# Patient Record
Sex: Female | Born: 1985 | Race: Black or African American | Hispanic: No | Marital: Single | Smoking: Never smoker
Health system: Southern US, Community
[De-identification: ages and names within clinical notes are randomized; demographics above are authoritative.]

## PROBLEM LIST (undated history)

## (undated) DIAGNOSIS — R87619 Unspecified abnormal cytological findings in specimens from cervix uteri: Secondary | ICD-10-CM

## (undated) HISTORY — PX: CERVICAL CERCLAGE: SHX1329

## (undated) HISTORY — PX: COLPOSCOPY: SHX161

---
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
## 2009-10-24 DIAGNOSIS — O3432 Maternal care for cervical incompetence, second trimester: Secondary | ICD-10-CM

## 2013-01-18 ENCOUNTER — Emergency Department (HOSPITAL_COMMUNITY): Payer: BC Managed Care – PPO

## 2013-01-18 ENCOUNTER — Emergency Department (HOSPITAL_COMMUNITY)
Admission: EM | Admit: 2013-01-18 | Discharge: 2013-01-18 | Disposition: A | Discharge status: Home / Self Care | Payer: BC Managed Care – PPO | Attending: Emergency Medicine | Admitting: Emergency Medicine

## 2013-01-18 ENCOUNTER — Encounter (HOSPITAL_COMMUNITY): Payer: Self-pay | Admitting: Emergency Medicine

## 2013-01-18 DIAGNOSIS — S0990XA Unspecified injury of head, initial encounter: Secondary | ICD-10-CM | POA: Insufficient documentation

## 2013-01-18 DIAGNOSIS — Y9389 Activity, other specified: Secondary | ICD-10-CM | POA: Insufficient documentation

## 2013-01-18 DIAGNOSIS — Y92410 Unspecified street and highway as the place of occurrence of the external cause: Secondary | ICD-10-CM | POA: Insufficient documentation

## 2013-01-18 DIAGNOSIS — S139XXA Sprain of joints and ligaments of unspecified parts of neck, initial encounter: Secondary | ICD-10-CM

## 2013-01-18 MED ORDER — HYDROCODONE-ACETAMINOPHEN 5-325 MG PO TABS
1.0000 | ORAL_TABLET | Freq: Once | ORAL | Status: AC
  Administered 2013-01-18: 1 via ORAL
  Filled 2013-01-18: qty 1

## 2013-01-18 MED ORDER — HYDROCODONE-ACETAMINOPHEN 5-325 MG PO TABS: 2.0000 | ORAL_TABLET | ORAL | Status: DC | PRN

## 2013-01-18 NOTE — ED Notes (Signed)
Pt back to room.
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
## 2013-01-18 NOTE — ED Notes (Signed)
Updated pt on poc and delay 

## 2013-01-18 NOTE — ED Notes (Signed)
Pt to CT scan via stretcher with transport 
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
## 2013-09-30 ENCOUNTER — Emergency Department (HOSPITAL_COMMUNITY)
Admission: EM | Admit: 2013-09-30 | Discharge: 2013-09-30 | Disposition: A | Discharge status: Home / Self Care | Payer: BC Managed Care – PPO | Attending: Emergency Medicine | Admitting: Emergency Medicine

## 2013-09-30 ENCOUNTER — Encounter (HOSPITAL_COMMUNITY): Payer: Self-pay | Admitting: Emergency Medicine

## 2013-09-30 DIAGNOSIS — M25562 Pain in left knee: Secondary | ICD-10-CM

## 2013-09-30 DIAGNOSIS — M25569 Pain in unspecified knee: Secondary | ICD-10-CM | POA: Insufficient documentation

## 2013-09-30 DIAGNOSIS — R209 Unspecified disturbances of skin sensation: Secondary | ICD-10-CM | POA: Insufficient documentation

## 2013-09-30 MED ORDER — IBUPROFEN 800 MG PO TABS: 800.0000 mg | ORAL_TABLET | Freq: Three times a day (TID) | ORAL | Status: DC
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
## 2013-09-30 NOTE — ED Notes (Signed)
Painful lt knee for 2 weeks no known injury.  She works on a [employer] doctor. No swelling

## 2013-09-30 NOTE — ED Notes (Signed)
PA Katie at bedside. 

## 2013-09-30 NOTE — ED Provider Notes (Signed)
Medical screening examination/treatment/procedure(s) were performed by non-physician practitioner and as supervising physician I was immediately available for consultation/collaboration.  EKG Interpretation   None         Hanley Seamen, MD 09/30/13 [phone]

## 2014-03-24 ENCOUNTER — Encounter (HOSPITAL_COMMUNITY): Payer: Self-pay | Admitting: Emergency Medicine

## 2014-03-24 ENCOUNTER — Emergency Department (INDEPENDENT_AMBULATORY_CARE_PROVIDER_SITE_OTHER)
Admission: EM | Admit: 2014-03-24 | Discharge: 2014-03-24 | Disposition: A | Discharge status: Home / Self Care | Payer: Self-pay | Source: Home / Self Care | Attending: Family Medicine | Admitting: Family Medicine

## 2014-03-24 DIAGNOSIS — B9562 Methicillin resistant Staphylococcus aureus infection as the cause of diseases classified elsewhere: Secondary | ICD-10-CM

## 2014-03-24 DIAGNOSIS — L0390 Cellulitis, unspecified: Principal | ICD-10-CM

## 2014-03-24 DIAGNOSIS — A4902 Methicillin resistant Staphylococcus aureus infection, unspecified site: Secondary | ICD-10-CM

## 2014-03-24 DIAGNOSIS — L0291 Cutaneous abscess, unspecified: Secondary | ICD-10-CM

## 2014-03-24 MED ORDER — MUPIROCIN CALCIUM 2 % EX CREA: 1.0000 "application " | TOPICAL_CREAM | Freq: Two times a day (BID) | CUTANEOUS | Status: DC

## 2014-03-24 MED ORDER — MINOCYCLINE HCL 100 MG PO CAPS: 100.0000 mg | ORAL_CAPSULE | Freq: Two times a day (BID) | ORAL | Status: DC

## 2014-03-24 NOTE — ED Notes (Signed)
C/o another boil on her leg

## 2014-03-24 NOTE — ED Provider Notes (Signed)
CSN: [id number]     Arrival date & time 03/24/14  1736 History   First MD Initiated Contact with Patient 03/24/14 1819     Chief Complaint  Patient presents with  . Skin Problem   (Consider location/radiation/quality/duration/timing/severity/associated sxs/prior Treatment) Patient is a 28 y.o. female presenting with abscess.  Abscess Location:  Leg Leg abscess location:  R lower leg Abscess quality: painful and redness   Abscess quality: not draining and no fluctuance   Red streaking: no   Duration:  3 days Progression:  Worsening Pain details:    Severity:  Mild Chronicity:  Recurrent Associated symptoms: no fever   Risk factors: family hx of MRSA and prior abscess     History reviewed. No pertinent past medical history. History reviewed. No pertinent past surgical history. History reviewed. No pertinent family history. History  Substance Use Topics  . Smoking status: Never Smoker   . Smokeless tobacco: Not on file  . Alcohol Use: No   OB History   Grav Para Term Preterm Abortions TAB SAB Ect Mult Living                 Review of Systems  Constitutional: Negative.  Negative for fever.  Skin: Positive for rash.    Allergies  Review of patient's allergies indicates no known allergies.  Home Medications   Prior to Admission medications   Medication Sig Start Date End Date Taking? Authorizing Provider  HYDROcodone-acetaminophen (NORCO/VICODIN) 5-325 MG per tablet Take 2 tablets by mouth every 4 (four) hours as needed for pain. 01/18/13   Chionesu Lytle Michaels, MD  ibuprofen (ADVIL,MOTRIN) 800 MG tablet Take 1 tablet (800 mg total) by mouth 3 (three) times daily. 09/30/13   Arie Sabina Schinlever, PA-C  minocycline (MINOCIN,DYNACIN) 100 MG capsule Take 1 capsule (100 mg total) by mouth 2 (two) times daily. 03/24/14   Linna Hoff, MD  mupirocin cream (BACTROBAN) 2 % Apply 1 application topically 2 (two) times daily. 03/24/14   Linna Hoff, MD   BP 116/78  Pulse 87   Temp(Src) 97.9 F (36.6 C) (Oral)  Resp 16  SpO2 100% Physical Exam  Nursing note and vitals reviewed. Constitutional: She is oriented to person, place, and time. She appears well-developed and well-nourished.  Musculoskeletal: She exhibits tenderness.  Neurological: She is alert and oriented to person, place, and time.  Skin: Skin is warm and dry. Rash noted.  Tender indurated erythematous papule to right lat calf , no fluctuance or drainage expressed.    ED Course  Procedures (including critical care time) Labs Review Labs Reviewed - No data to display  Imaging Review No results found.   MDM   1. MRSA cellulitis        Linna Hoff, MD 03/24/14 [phone]

## 2014-03-24 NOTE — Discharge Instructions (Signed)
Warm compress twice a day when you take the antibiotic, take all of medicine, return as needed. °

## 2014-03-28 ENCOUNTER — Encounter (HOSPITAL_COMMUNITY): Payer: Self-pay | Admitting: Emergency Medicine

## 2014-03-28 DIAGNOSIS — L03119 Cellulitis of unspecified part of limb: Principal | ICD-10-CM

## 2014-03-28 DIAGNOSIS — Z792 Long term (current) use of antibiotics: Secondary | ICD-10-CM | POA: Insufficient documentation

## 2014-03-28 DIAGNOSIS — Z79899 Other long term (current) drug therapy: Secondary | ICD-10-CM | POA: Insufficient documentation

## 2014-03-28 DIAGNOSIS — R51 Headache: Secondary | ICD-10-CM | POA: Insufficient documentation

## 2014-03-28 DIAGNOSIS — L02419 Cutaneous abscess of limb, unspecified: Secondary | ICD-10-CM | POA: Insufficient documentation

## 2014-03-28 DIAGNOSIS — Z8614 Personal history of Methicillin resistant Staphylococcus aureus infection: Secondary | ICD-10-CM | POA: Insufficient documentation

## 2014-03-28 NOTE — ED Notes (Signed)
Patient with boil on outer right calf.  She was seen on Monday, did not start the antibiotics until yesterday.  Patient states that she is also with a headache for last two days.

## 2014-03-29 ENCOUNTER — Emergency Department (HOSPITAL_COMMUNITY)
Admission: EM | Admit: 2014-03-29 | Discharge: 2014-03-29 | Disposition: A | Discharge status: Home / Self Care | Payer: Self-pay | Attending: Emergency Medicine | Admitting: Emergency Medicine

## 2014-03-29 DIAGNOSIS — L02415 Cutaneous abscess of right lower limb: Secondary | ICD-10-CM

## 2014-03-29 MED ORDER — IBUPROFEN 400 MG PO TABS
800.0000 mg | ORAL_TABLET | Freq: Once | ORAL | Status: AC
  Administered 2014-03-29: 800 mg via ORAL
  Filled 2014-03-29: qty 2

## 2014-03-29 NOTE — ED Provider Notes (Signed)
Medical screening examination/treatment/procedure(s) were performed by non-physician practitioner and as supervising physician I was immediately available for consultation/collaboration.   EKG Interpretation None        Ariyanah Aguado, MD 03/29/14 2302 

## 2014-03-29 NOTE — Discharge Instructions (Signed)
Abscess °An abscess is an infected area that contains a collection of pus and debris. It can occur in almost any part of the body. An abscess is also known as a furuncle or boil. °CAUSES  °An abscess occurs when tissue gets infected. This can occur from blockage of oil or sweat glands, infection of hair follicles, or a minor injury to the skin. As the body tries to fight the infection, pus collects in the area and creates pressure under the skin. This pressure causes pain. People with weakened immune systems have difficulty fighting infections and get certain abscesses more often.  °SYMPTOMS °Usually an abscess develops on the skin and becomes a painful mass that is red, warm, and tender. If the abscess forms under the skin, you may feel a moveable soft area under the skin. Some abscesses break open (rupture) on their own, but most will continue to get worse without care. The infection can spread deeper into the body and eventually into the bloodstream, causing you to feel ill.  °DIAGNOSIS  °Your caregiver will take your medical history and perform a physical exam. A sample of fluid may also be taken from the abscess to determine what is causing your infection. °TREATMENT  °Your caregiver may prescribe antibiotic medicines to fight the infection. However, taking antibiotics alone usually does not cure an abscess. Your caregiver may need to make a small cut (incision) in the abscess to drain the pus. In some cases, gauze is packed into the abscess to reduce pain and to continue draining the area. °HOME CARE INSTRUCTIONS  °· Only take over-the-counter or prescription medicines for pain, discomfort, or fever as directed by your caregiver. °· If you were prescribed antibiotics, take them as directed. Finish them even if you start to feel better. °· If gauze is used, follow your caregiver's directions for changing the gauze. °· To avoid spreading the infection: °· Keep your draining abscess covered with a  bandage. °· Wash your hands well. °· Do not share personal care items, towels, or whirlpools with others. °· Avoid skin contact with others. °· Keep your skin and clothes clean around the abscess. °· Keep all follow-up appointments as directed by your caregiver. °SEEK MEDICAL CARE IF:  °· You have increased pain, swelling, redness, fluid drainage, or bleeding. °· You have muscle aches, chills, or a general ill feeling. °· You have a fever. °MAKE SURE YOU:  °· Understand these instructions. °· Will watch your condition. °· Will get help right away if you are not doing well or get worse. °Document Released: 07/20/2005 Document Revised: 04/10/2012 Document Reviewed: 12/23/2011 °ExitCare® Patient Information ©2014 [employer]. ° °Abscess °Care After °An abscess (also called a boil or furuncle) is an infected area that contains a collection of pus. Signs and symptoms of an abscess include pain, tenderness, redness, or hardness, or you may feel a moveable soft area under your skin. An abscess can occur anywhere in the body. The infection may spread to surrounding tissues causing cellulitis. A cut (incision) by the surgeon was made over your abscess and the pus was drained out. Gauze may have been packed into the space to provide a drain that will allow the cavity to heal from the inside outwards. The boil may be painful for 5 to 7 days. Most people with a boil do not have high fevers. Your abscess, if seen early, may not have localized, and may not have been lanced. If not, another appointment may be required for this if it does   not get better on its own or with medications. °HOME CARE INSTRUCTIONS  °· Only take over-the-counter or prescription medicines for pain, discomfort, or fever as directed by your caregiver. °· When you bathe, soak and then remove gauze or iodoform packs at least daily or as directed by your caregiver. You may then wash the wound gently with mild soapy water. Repack with gauze or do as your  caregiver directs. °SEEK IMMEDIATE MEDICAL CARE IF:  °· You develop increased pain, swelling, redness, drainage, or bleeding in the wound site. °· You develop signs of generalized infection including muscle aches, chills, fever, or a general ill feeling. °· An oral temperature above 102° F (38.9° C) develops, not controlled by medication. °See your caregiver for a recheck if you develop any of the symptoms described above. If medications (antibiotics) were prescribed, take them as directed. °Document Released: 04/28/2005 Document Revised: 01/02/2012 Document Reviewed: 12/24/2007 °ExitCare® Patient Information ©2014 [employer]. ° °

## 2014-03-29 NOTE — ED Provider Notes (Signed)
CSN: [id number]     Arrival date & time 03/28/14  2137 History   First MD Initiated Contact with Patient 03/29/14 0100     Chief Complaint  Patient presents with  . Headache  . Recurrent Skin Infections     (Consider location/radiation/quality/duration/timing/severity/associated sxs/prior Treatment) HPI Comments: Patient seen on Monday at urgent care for R leg abscess,. + fam hx of MRSA. No personal history of abscess. Began taking minocycline last night ( 1 dose) prescribed by UC. She c/o constant, progressively worsening throbbing pain. Nothing makes it worse or better.Denies fevers, chills, myalgias, arthralgias. Denies DOE, SOB, chest tightness or pressure, radiation to left arm, jaw or back, or diaphoresis. Denies dysuria, flank pain, suprapubic pain, frequency, urgency, or hematuria. Denies headaches, light headedness, weakness, visual disturbances. Denies abdominal pain, nausea, vomiting, diarrhea or constipation.    Patient is a 28 y.o. female presenting with abscess. The history is provided by the patient. No language interpreter was used.  Abscess Location:  Leg Leg abscess location:  R leg Size:  2 cm Abscess quality: draining, fluctuance, induration, painful and redness   Red streaking: no   Duration:  4 days Progression:  Worsening Pain details:    Quality:  Aching and throbbing   Severity:  Moderate   Timing:  Constant Context: not diabetes, not immunosuppression, not injected drug use and not skin injury   Relieved by:  Nothing Worsened by:  Nothing tried Ineffective treatments:  None tried Associated symptoms: no anorexia, no fatigue, no fever, no headaches, no nausea and no vomiting   Risk factors: family hx of MRSA   Risk factors: no prior abscess      History reviewed. No pertinent past medical history. History reviewed. No pertinent past surgical history. History reviewed. No pertinent family history. History  Substance Use Topics  . Smoking status:  Never Smoker   . Smokeless tobacco: Not on file  . Alcohol Use: No   OB History   Grav Para Term Preterm Abortions TAB SAB Ect Mult Living                 Review of Systems  Constitutional: Negative for fever and fatigue.  Gastrointestinal: Negative for nausea, vomiting and anorexia.  Skin: Positive for wound.  Neurological: Negative for headaches.  All other systems reviewed and are negative.     Allergies  Review of patient's allergies indicates no known allergies.  Home Medications   Prior to Admission medications   Medication Sig Start Date End Date Taking? Authorizing Provider  minocycline (MINOCIN,DYNACIN) 100 MG capsule Take 1 capsule (100 mg total) by mouth 2 (two) times daily. 03/24/14  Yes Linna Hoff, MD  mupirocin cream (BACTROBAN) 2 % Apply 1 application topically 2 (two) times daily. 03/24/14   Linna Hoff, MD   BP 107/67  Pulse 71  Temp(Src) 98.4 F (36.9 C) (Oral)  Resp 16  Ht 5\' 3"  (1.6 m)  Wt 137 lb (62.143 kg)  BMI 24.27 kg/m2  SpO2 100%  LMP 03/14/2014 Physical Exam  Constitutional: She is oriented to person, place, and time. She appears well-developed and well-nourished. No distress.  HENT:  Head: Normocephalic and atraumatic.  Eyes: Conjunctivae are normal. No scleral icterus.  Neck: Normal range of motion.  Cardiovascular: Normal rate, regular rhythm and normal heart sounds.  Exam reveals no gallop and no friction rub.   No murmur heard. Pulmonary/Chest: Effort normal and breath sounds normal. No respiratory distress.  Abdominal: Soft. Bowel sounds are normal.  She exhibits no distension and no mass. There is no tenderness. There is no guarding.  Neurological: She is alert and oriented to person, place, and time.  Skin: Skin is warm and dry. She is not diaphoretic.  2cm open lesion mild purulen drainage. TTP, warm. Mild erythema, 4 cm surrounding induration.  Psychiatric: Her behavior is normal.    ED Course  Procedures (including critical  care time) Labs Review Labs Reviewed  CULTURE, ROUTINE-ABSCESS   INCISION AND DRAINAGE Performed by: Arthor CaptainAbigail Qusai Kem Consent: Verbal consent obtained. Risks and benefits: risks, benefits and alternatives were discussed Type: abscess  Body area: r leg  Anesthesia: local infiltration  Incision was made with a scalpel.  Local anesthetic: lidocaine 2% w epinephrine  Anesthetic total: 6 ml  Complexity: complex Blunt dissection to break up loculations  Drainage: purulent, sebaceous  Drainage amount: minimal  Packing material: 1/4 in iodoform gauze  Patient tolerance: Patient tolerated the procedure well with no immediate complications.    Imaging Review No results found.   EKG Interpretation None      MDM   Final diagnoses:  Abscess of right leg    Patient with apparent infected sebaceous cyst of the R leg., large cavity required packing. No streaking or surrounding cellulitis. No signs of systemic infection. Abscess culture obtained.  Continue with ABX. F/u 2 days for packing removal. Patient / Family / Caregiver informed of clinical course, understand medical decision-making process, and agree with plan. return precautions discussed.     Arthor CaptainAbigail Jhanvi Drakeford, PA-C 03/29/14 1146

## 2014-04-01 ENCOUNTER — Telehealth (HOSPITAL_BASED_OUTPATIENT_CLINIC_OR_DEPARTMENT_OTHER): Payer: Self-pay | Admitting: Emergency Medicine

## 2014-04-01 ENCOUNTER — Encounter (HOSPITAL_COMMUNITY): Payer: Self-pay | Admitting: Emergency Medicine

## 2014-04-01 ENCOUNTER — Emergency Department (INDEPENDENT_AMBULATORY_CARE_PROVIDER_SITE_OTHER)
Admission: EM | Admit: 2014-04-01 | Discharge: 2014-04-01 | Disposition: A | Discharge status: Home / Self Care | Payer: Self-pay | Source: Home / Self Care | Attending: Emergency Medicine | Admitting: Emergency Medicine

## 2014-04-01 DIAGNOSIS — L0291 Cutaneous abscess, unspecified: Secondary | ICD-10-CM

## 2014-04-01 DIAGNOSIS — Z22322 Carrier or suspected carrier of Methicillin resistant Staphylococcus aureus: Secondary | ICD-10-CM

## 2014-04-01 DIAGNOSIS — L0390 Cellulitis, unspecified: Secondary | ICD-10-CM

## 2014-04-01 LAB — CULTURE, ROUTINE-ABSCESS

## 2014-04-01 MED ORDER — FLUCONAZOLE 150 MG PO TABS: 150.0000 mg | ORAL_TABLET | Freq: Once | ORAL | Status: DC

## 2014-04-01 MED ORDER — MUPIROCIN 2 % EX OINT: TOPICAL_OINTMENT | CUTANEOUS | Status: DC

## 2014-04-01 NOTE — ED Provider Notes (Signed)
  Chief Complaint   Chief Complaint  Patient presents with  . Follow-up    History of Present Illness   Kalayla Saccone is a 28 year old female who has an abscess on her right lower leg. She was seen here for this on June 1 and given doxycycline. She had been exposed to MRSA. She return to the emergency room on June 5 and underwent an incision and drainage. This was packed and she returns today to have the packing removed. Her cultures growing out MRSA. She's not had a prior history of skin infections, abscesses, or murmurs.  Review of Systems   Other than as noted above, the patient denies any of the following symptoms: Systemic:  No fever, chills or sweats. Skin:  No rash or itching.  PMFSH   Past medical history, family history, social history, meds, and allergies were reviewed.   Physical Examination     Vital signs:  BP 114/72  Pulse 90  Temp(Src) 98.6 F (37 C) (Oral)  Resp 14  SpO2 99%  LMP 03/20/2014 Skin:  There is an abscess on the right lower leg status post incision and drainage. Packing is in place. There is no surrounding erythema, tenderness to palpation, crepitus, or necrosis.  There was no crepitus, necrosis, ecchymosis, or herrhagic bullae. Skin exam was otherwise normal.  No rash. Ext:  Distal pulses were full, patient has full ROM of all joints.  Course in Urgent Care Center   The bandage was removed and the packing was taken out. The wound cavity appears clean and clear without any exudate or drainage. Antibiotic ointment was applied and a sterile dressing.  Assessment   The primary encounter diagnosis was Abscess. A diagnosis of MRSA (methicillin resistant staph aureus) culture positive was also pertinent to this visit.  Plan     1.  Meds:  The following meds were prescribed:   Discharge Medication List as of 04/01/2014  5:07 PM    START taking these medications   Details  fluconazole (DIFLUCAN) 150 MG tablet Take 1 tablet (150 mg total) by mouth  once., Starting 04/01/2014, Normal    mupirocin ointment (BACTROBAN) 2 % Apply to both nostrils TID for 1 month, Normal        2.  Patient Education/Counseling:  The patient was given appropriate handouts, self care instructions, and instructed in symptomatic relief.    3.  Follow up:  The patient was instructed to leave the dressing in place and return here again in 48 hours for packing removal, or sooner if becoming worse in any way, and given some red flag symptoms such as fever which would prompt immediate return.       Reuben Likes, MD 04/01/14 2150

## 2014-04-01 NOTE — Telephone Encounter (Signed)
Lab called + abscess culture, +MRSA. Chart sent to EDP for review.

## 2014-04-01 NOTE — Discharge Instructions (Signed)
Now that the packing has been removed from your abscess, you may bathe and shower as normal.  You should change the dressing at least once a day.  You may change it more often if it becomes soiled with drainage.  Wash the wound well with soap and water, pat dry, apply an antibiotic ointment (Neosporin, Polysporin, or Bacitracin are all OK), then cover with a non-stick dressing such as Telfa with several layers of plain gause over that to absorb drainage.  You may secure this in place with tape or with roll gauze and tape.  Keep the wound covered for until it stops draining.  This usually takes 7 days, but may be longer.  Return for a recheck if you have heavy bleeding, increasing pain, fever, or the wound looks worse.  ° °Bacterial infection is a leading cause of boils, abscesses and skin infections.  While this can be the cause of serious infections, it is most often easily treated, prevented, and cured. ° °This bacteria,like other bacterial infections, is something you catch from somebody or something.  It can be transmitted from family, friends, casual acquaintances or even pets by close person-to-person contact or contact with an infected object.  Bacteria live on the skin and in the nasal passages.  It can invade into the skin through a break in the skin or sometimes it just invades by itself into a skin pore causing an area of redness, swelling and pain.  When this happens, it can cause a sticking sensation, so many people mistake it for an insect bite.   ° °If it causes a collection of pus (a boil or abscess) it should be drained.  Often packing or a drain will be left in place to allow the pus to drain for a few days.  This packing will need to be removed after 2 to 3 days.  If the wound is deep, it may need to be repacked.  If there is no collection of pus (cellulitis) incision and drainage is not immediately necessary, but antibiotics will be prescribed.  Sometimes cellulitis will turn into an abscess, so  if symptoms persist, it's best to return for a recheck. ° °With bacterial infection, recurrence can be a problem.  This is because the bacteria can continue to live on the skin and in the nasal passages, presenting a risk for re-infection.  There are some steps that you can take to completely eradicate the infection and thus prevent future infections: ° °· Finish up your antibiotics completely.   °· Bacteria often take up residence in the nasal passages.  If they are not eliminated from this reservoir, the infection will return again and again.  Apply an antibiotic ointment, mupirocin, to both nostrils 3 times daily for a month.  °· Decontamination of the skin is also necessary. The current recommendation is Clorox baths twice weekly for 3 months.  This can be done by diluting 4 oz of Clorox bleach in a tub of bathwater.  Then soak in this Clorox solution up to your neck for 15 minutes. When you get out of the tub, do not towel dry, allow the Clorox water to dry off your skin on its own.  °· Take infectious precautions:  Wash or sanitize hands frequently, spray tub or shower with Lysol after use, us towels or wash cloths only once, then launder, do not share towels or wash cloths with anyone else, do not share clothing with anyone else, launder clothing and bed clothes frequently. ° ° ° ° °

## 2014-04-01 NOTE — ED Notes (Signed)
Pt is here for a f/u and to having packing removed from right lower extremity Reports she's taking Antibiotics but believes she may have yeast due to med Alert w/no signs of acute distress.

## 2014-04-06 ENCOUNTER — Telehealth (HOSPITAL_BASED_OUTPATIENT_CLINIC_OR_DEPARTMENT_OTHER): Payer: Self-pay | Admitting: Emergency Medicine

## 2014-04-06 NOTE — Telephone Encounter (Signed)
+  Abscess. Per pharmacist, patient returned to ED on 6/9 for dressing change. Will return in 48 hrs for another dressing change.

## 2014-07-20 ENCOUNTER — Encounter (HOSPITAL_COMMUNITY): Payer: Self-pay | Admitting: Emergency Medicine

## 2014-07-20 ENCOUNTER — Emergency Department (HOSPITAL_COMMUNITY)
Admission: EM | Admit: 2014-07-20 | Discharge: 2014-07-20 | Disposition: A | Discharge status: Home / Self Care | Payer: Self-pay | Attending: Emergency Medicine | Admitting: Emergency Medicine

## 2014-07-20 DIAGNOSIS — R110 Nausea: Secondary | ICD-10-CM | POA: Insufficient documentation

## 2014-07-20 DIAGNOSIS — Z79899 Other long term (current) drug therapy: Secondary | ICD-10-CM | POA: Insufficient documentation

## 2014-07-20 DIAGNOSIS — Z792 Long term (current) use of antibiotics: Secondary | ICD-10-CM | POA: Insufficient documentation

## 2014-07-20 DIAGNOSIS — J029 Acute pharyngitis, unspecified: Secondary | ICD-10-CM | POA: Insufficient documentation

## 2014-07-20 LAB — RAPID STREP SCREEN (MED CTR MEBANE ONLY): Streptococcus, Group A Screen (Direct): NEGATIVE

## 2014-07-20 MED ORDER — IBUPROFEN 800 MG PO TABS: 800.0000 mg | ORAL_TABLET | Freq: Three times a day (TID) | ORAL | Status: DC

## 2014-07-20 MED ORDER — LIDOCAINE VISCOUS 2 % MT SOLN: 20.0000 mL | OROMUCOSAL | Status: DC | PRN

## 2014-07-20 MED ORDER — LIDOCAINE VISCOUS 2 % MT SOLN
15.0000 mL | Freq: Once | OROMUCOSAL | Status: AC
Start: 2014-07-20 — End: 2014-07-20
  Administered 2014-07-20: 15 mL via OROMUCOSAL
  Filled 2014-07-20: qty 15

## 2014-07-20 MED ORDER — ACETAMINOPHEN 500 MG PO TABS: 500.0000 mg | ORAL_TABLET | Freq: Four times a day (QID) | ORAL | Status: DC | PRN

## 2014-07-20 MED ORDER — IBUPROFEN 400 MG PO TABS
800.0000 mg | ORAL_TABLET | Freq: Once | ORAL | Status: AC
  Administered 2014-07-20: 800 mg via ORAL
  Filled 2014-07-20: qty 2

## 2014-07-20 NOTE — ED Notes (Signed)
Pt c/o sore throat x 3 days; pt denies fever

## 2014-07-20 NOTE — ED Provider Notes (Signed)
Medical screening examination/treatment/procedure(s) were performed by non-physician practitioner and as supervising physician I was immediately available for consultation/collaboration.   EKG Interpretation None        Izekiel Flegel F Whitley Patchen, MD 07/20/14 1947 

## 2014-07-20 NOTE — Discharge Instructions (Signed)
Please follow up with your primary care physician in 1-2 days. If you do not have one please call the [facility] number listed above. Please alternate between Motrin and Tylenol every three hours for fevers and pain. Please read all discharge instructions and return precautions.  ° °Pharyngitis °Pharyngitis is redness, pain, and swelling (inflammation) of your pharynx.  °CAUSES  °Pharyngitis is usually caused by infection. Most of the time, these infections are from viruses (viral) and are part of a cold. However, sometimes pharyngitis is caused by bacteria (bacterial). Pharyngitis can also be caused by allergies. Viral pharyngitis may be spread from person to person by coughing, sneezing, and personal items or utensils (cups, forks, spoons, toothbrushes). Bacterial pharyngitis may be spread from person to person by more intimate contact, such as kissing.  °SIGNS AND SYMPTOMS  °Symptoms of pharyngitis include:   °· Sore throat.   °· Tiredness (fatigue).   °· Low-grade fever.   °· Headache. °· Joint pain and muscle aches. °· Skin rashes. °· Swollen lymph nodes. °· Plaque-like film on throat or tonsils (often seen with bacterial pharyngitis). °DIAGNOSIS  °Your health care provider will ask you questions about your illness and your symptoms. Your medical history, along with a physical exam, is often all that is needed to diagnose pharyngitis. Sometimes, a rapid strep test is done. Other lab tests may also be done, depending on the suspected cause.  °TREATMENT  °Viral pharyngitis will usually get better in 3-4 days without the use of medicine. Bacterial pharyngitis is treated with medicines that kill germs (antibiotics).  °HOME CARE INSTRUCTIONS  °· Drink enough water and fluids to keep your urine clear or pale yellow.   °· Only take over-the-counter or prescription medicines as directed by your health care provider:   °¨ If you are prescribed antibiotics, make sure you finish them even if you start  to feel better.   °¨ Do not take aspirin.   °· Get lots of rest.   °· Gargle with 8 oz of salt water (½ tsp of salt per 1 qt of water) as often as every 1-2 hours to soothe your throat.   °· Throat lozenges (if you are not at risk for choking) or sprays may be used to soothe your throat. °SEEK MEDICAL CARE IF:  °· You have large, tender lumps in your neck. °· You have a rash. °· You cough up green, yellow-brown, or bloody spit. °SEEK IMMEDIATE MEDICAL CARE IF:  °· Your neck becomes stiff. °· You drool or are unable to swallow liquids. °· You vomit or are unable to keep medicines or liquids down. °· You have severe pain that does not go away with the use of recommended medicines. °· You have trouble breathing (not caused by a stuffy nose). °MAKE SURE YOU:  °· Understand these instructions. °· Will watch your condition. °· Will get help right away if you are not doing well or get worse. °Document Released: 10/10/2005 Document Revised: 07/31/2013 Document Reviewed: 06/17/2013 °[employer]® Patient Information ©2015 [employer]. This information is not intended to replace advice given to you by your health care provider. Make sure you discuss any questions you have with your health care provider. ° °

## 2014-07-20 NOTE — ED Provider Notes (Signed)
CSN: [id number]     Arrival date & time 07/20/14  1308 History  This chart was scribed for Francee Piccolo PA-C working with Shon Baton, MD by Freida Busman, ED Scribe. This patient was seen in room TR09C/TR09C and the patient's care was started at 1:52 PM.    Chief Complaint  Patient presents with  . Sore Throat   The history is provided by the patient. No language interpreter was used.    HPI Comments:  Karina Ray is a 28 y.o. female who presents to the Emergency Department complaining of sore throat for 3 days and cough for 2 weeks. She has gargled with warm salt water with mild relief. Her pain is exacerbated when swallowing.  She also reports associated subjective fever, chills and nausea. She denies vomiting.   History reviewed. No pertinent past medical history. History reviewed. No pertinent past surgical history. History reviewed. No pertinent family history. History  Substance Use Topics  . Smoking status: Never Smoker   . Smokeless tobacco: Not on file  . Alcohol Use: No   OB History   Grav Para Term Preterm Abortions TAB SAB Ect Mult Living                 Review of Systems  Constitutional: Positive for fever and chills.       Subjective fever  HENT: Positive for sore throat.   Respiratory: Positive for cough.   Gastrointestinal: Positive for nausea. Negative for vomiting.  All other systems reviewed and are negative.     Allergies  Review of patient's allergies indicates no known allergies.  Home Medications   Prior to Admission medications   Medication Sig Start Date End Date Taking? Authorizing Provider  acetaminophen (TYLENOL) 500 MG tablet Take 1 tablet (500 mg total) by mouth every 6 (six) hours as needed. 07/20/14   Jasha Hodzic L Ambrea Hegler, PA-C  fluconazole (DIFLUCAN) 150 MG tablet Take 1 tablet (150 mg total) by mouth once. 04/01/14   Reuben Likes, MD  ibuprofen (ADVIL,MOTRIN) 800 MG tablet Take 1 tablet (800 mg total) by mouth 3  (three) times daily. 07/20/14   Laterica Matarazzo L Abbagayle Zaragoza, PA-C  lidocaine (XYLOCAINE) 2 % solution Use as directed 20 mLs in the mouth or throat as needed for mouth pain. 07/20/14   Bettyanne Dittman L Miro Balderson, PA-C  minocycline (MINOCIN,DYNACIN) 100 MG capsule Take 1 capsule (100 mg total) by mouth 2 (two) times daily. 03/24/14   Linna Hoff, MD  mupirocin cream (BACTROBAN) 2 % Apply 1 application topically 2 (two) times daily. 03/24/14   Linna Hoff, MD  mupirocin ointment (BACTROBAN) 2 % Apply to both nostrils TID for 1 month 04/01/14   Reuben Likes, MD   BP 118/75  Pulse 93  Temp(Src) 98.5 F (36.9 C) (Oral)  Resp 18  SpO2 100% Physical Exam  Nursing note and vitals reviewed. Constitutional: She is oriented to person, place, and time. She appears well-developed and well-nourished. No distress.  HENT:  Head: Normocephalic and atraumatic.  Right Ear: External ear normal.  Left Ear: External ear normal.  Nose: Nose normal.  Mouth/Throat: Uvula is midline and mucous membranes are normal. Posterior oropharyngeal erythema present. No oropharyngeal exudate, posterior oropharyngeal edema or tonsillar abscesses.  Eyes: Conjunctivae are normal.  Neck: Normal range of motion. Neck supple.  Cardiovascular: Normal rate and regular rhythm.   Pulmonary/Chest: Breath sounds normal. No respiratory distress.  Abdominal: Soft.  Musculoskeletal: Normal range of motion.  Lymphadenopathy:    She  has cervical adenopathy.  Neurological: She is alert and oriented to person, place, and time.  Skin: Skin is warm and dry. She is not diaphoretic.  Psychiatric: She has a normal mood and affect.    ED Course  Procedures  Medications  lidocaine (XYLOCAINE) 2 % viscous mouth solution 15 mL (15 mLs Mouth/Throat Given 07/20/14 1357)  ibuprofen (ADVIL,MOTRIN) tablet 800 mg (800 mg Oral Given 07/20/14 1356)    DIAGNOSTIC STUDIES:  Oxygen Saturation is 100% on RA, normal by my interpretation.    COORDINATION OF  CARE:  1:56 PM Discussed treatment plan with pt at bedside and pt agreed to plan.  Labs Review Labs Reviewed  RAPID STREP SCREEN  CULTURE, GROUP A STREP    Imaging Review No results found.   EKG Interpretation None      MDM   Final diagnoses:  Viral pharyngitis    Filed Vitals:   07/20/14 1324  BP: 118/75  Pulse: 93  Temp: 98.5 F (36.9 C)  Resp: 18   Afebrile, NAD, non-toxic appearing, AAOx4.   Pt afebrile without tonsillar exudate, negative strep. Presents with mild cervical lymphadenopathy, & dysphagia; diagnosis of viral pharyngitis. No abx indicated. DC w symptomatic tx for pain  Pt does not appear dehydrated, but did discuss importance of water rehydration. Presentation non concerning for PTA or infxn spread to soft tissue. No trismus or uvula deviation. Specific return precautions discussed. Pt able to drink water in ED without difficulty with intact air way. Recommended PCP follow up.      I personally performed the services described in this documentation, which was scribed in my presence. The recorded information has been reviewed and is accurate.     Jeannetta Ellis, PA-C 07/20/14 1523

## 2014-07-22 LAB — CULTURE, GROUP A STREP

## 2014-09-12 ENCOUNTER — Emergency Department: Payer: Self-pay | Admitting: Emergency Medicine

## 2014-09-12 LAB — URINALYSIS, COMPLETE
BLOOD: NEGATIVE
Bacteria: NONE SEEN
Bilirubin,UR: NEGATIVE
GLUCOSE, UR: NEGATIVE mg/dL (ref 0–75)
KETONE: NEGATIVE
Leukocyte Esterase: NEGATIVE
Nitrite: NEGATIVE
Ph: 5 (ref 4.5–8.0)
Protein: NEGATIVE
RBC,UR: 3 /HPF (ref 0–5)
Specific Gravity: 1.024 (ref 1.003–1.030)

## 2014-09-12 LAB — CBC WITH DIFFERENTIAL/PLATELET
BASOS ABS: 0 10*3/uL (ref 0.0–0.1)
Basophil %: 0.4 %
EOS ABS: 0.1 10*3/uL (ref 0.0–0.7)
Eosinophil %: 1.5 %
HCT: 38.1 % (ref 35.0–47.0)
HGB: 12.4 g/dL (ref 12.0–16.0)
Lymphocyte #: 2.3 10*3/uL (ref 1.0–3.6)
Lymphocyte %: 24.9 %
MCH: 30.5 pg (ref 26.0–34.0)
MCHC: 32.6 g/dL (ref 32.0–36.0)
MCV: 94 fL (ref 80–100)
MONO ABS: 0.7 x10 3/mm (ref 0.2–0.9)
Monocyte %: 7.2 %
NEUTROS ABS: 6.1 10*3/uL (ref 1.4–6.5)
NEUTROS PCT: 66 %
Platelet: 349 10*3/uL (ref 150–440)
RBC: 4.07 10*6/uL (ref 3.80–5.20)
RDW: 13.5 % (ref 11.5–14.5)
WBC: 9.2 10*3/uL (ref 3.6–11.0)

## 2014-09-12 LAB — COMPREHENSIVE METABOLIC PANEL
ALK PHOS: 70 U/L
AST: 19 U/L (ref 15–37)
Albumin: 3.3 g/dL — ABNORMAL LOW (ref 3.4–5.0)
Anion Gap: 5 — ABNORMAL LOW (ref 7–16)
BILIRUBIN TOTAL: 0.2 mg/dL (ref 0.2–1.0)
BUN: 10 mg/dL (ref 7–18)
CALCIUM: 8.6 mg/dL (ref 8.5–10.1)
Chloride: 109 mmol/L — ABNORMAL HIGH (ref 98–107)
Co2: 26 mmol/L (ref 21–32)
Creatinine: 0.76 mg/dL (ref 0.60–1.30)
EGFR (Non-African Amer.): >60
GLUCOSE: 93 mg/dL (ref 65–99)
Osmolality: 278 (ref 275–301)
Potassium: 4 mmol/L (ref 3.5–5.1)
SGPT (ALT): 19 U/L
SODIUM: 140 mmol/L (ref 136–145)
TOTAL PROTEIN: 8.1 g/dL (ref 6.4–8.2)

## 2014-09-12 LAB — WET PREP, GENITAL

## 2014-09-12 LAB — PREGNANCY, URINE: PREGNANCY TEST, URINE: NEGATIVE m[IU]/mL

## 2014-09-12 LAB — LIPASE, BLOOD: LIPASE: 221 U/L (ref 73–393)

## 2014-09-14 LAB — GC/CHLAMYDIA PROBE AMP

## 2014-12-30 ENCOUNTER — Emergency Department: Payer: Self-pay | Admitting: Emergency Medicine

## 2015-10-23 DIAGNOSIS — O3433 Maternal care for cervical incompetence, third trimester: Secondary | ICD-10-CM

## 2015-10-23 DIAGNOSIS — O368390 Maternal care for abnormalities of the fetal heart rate or rhythm, unspecified trimester, not applicable or unspecified: Secondary | ICD-10-CM

## 2016-08-04 ENCOUNTER — Emergency Department
Admission: EM | Admit: 2016-08-04 | Discharge: 2016-08-04 | Disposition: A | Discharge status: Home / Self Care | Payer: BLUE CROSS/BLUE SHIELD | Attending: Emergency Medicine | Admitting: Emergency Medicine

## 2016-08-04 DIAGNOSIS — B373 Candidiasis of vulva and vagina: Secondary | ICD-10-CM | POA: Diagnosis not present

## 2016-08-04 DIAGNOSIS — N3000 Acute cystitis without hematuria: Secondary | ICD-10-CM | POA: Insufficient documentation

## 2016-08-04 DIAGNOSIS — B3731 Acute candidiasis of vulva and vagina: Secondary | ICD-10-CM

## 2016-08-04 DIAGNOSIS — R1030 Lower abdominal pain, unspecified: Secondary | ICD-10-CM | POA: Diagnosis present

## 2016-08-04 LAB — CBC WITH DIFFERENTIAL/PLATELET
BASOS ABS: 0 10*3/uL (ref 0–0.1)
BASOS PCT: 1 %
EOS ABS: 0.4 10*3/uL (ref 0–0.7)
EOS PCT: 6 %
HCT: 33.9 % — ABNORMAL LOW (ref 35.0–47.0)
Hemoglobin: 11.7 g/dL — ABNORMAL LOW (ref 12.0–16.0)
Lymphocytes Relative: 28 %
Lymphs Abs: 2.3 10*3/uL (ref 1.0–3.6)
MCH: 29.9 pg (ref 26.0–34.0)
MCHC: 34.3 g/dL (ref 32.0–36.0)
MCV: 87.2 fL (ref 80.0–100.0)
MONO ABS: 0.7 10*3/uL (ref 0.2–0.9)
Monocytes Relative: 9 %
Neutro Abs: 4.7 10*3/uL (ref 1.4–6.5)
Neutrophils Relative %: 56 %
PLATELETS: 279 10*3/uL (ref 150–440)
RBC: 3.89 MIL/uL (ref 3.80–5.20)
RDW: 13.8 % (ref 11.5–14.5)
WBC: 8.2 10*3/uL (ref 3.6–11.0)

## 2016-08-04 LAB — BASIC METABOLIC PANEL
ANION GAP: 4 — AB (ref 5–15)
BUN: 11 mg/dL (ref 6–20)
CALCIUM: 8.8 mg/dL — AB (ref 8.9–10.3)
CO2: 26 mmol/L (ref 22–32)
CREATININE: 0.67 mg/dL (ref 0.44–1.00)
Chloride: 107 mmol/L (ref 101–111)
Glucose, Bld: 116 mg/dL — ABNORMAL HIGH (ref 65–99)
Potassium: 3.5 mmol/L (ref 3.5–5.1)
Sodium: 137 mmol/L (ref 135–145)

## 2016-08-04 LAB — WET PREP, GENITAL
Clue Cells Wet Prep HPF POC: NONE SEEN
SPERM: NONE SEEN
Trich, Wet Prep: NONE SEEN

## 2016-08-04 LAB — POCT PREGNANCY, URINE: Preg Test, Ur: NEGATIVE

## 2016-08-04 LAB — URINALYSIS COMPLETE WITH MICROSCOPIC (ARMC ONLY)
Bilirubin Urine: NEGATIVE
GLUCOSE, UA: NEGATIVE mg/dL
Hgb urine dipstick: NEGATIVE
KETONES UR: NEGATIVE mg/dL
NITRITE: NEGATIVE
PROTEIN: NEGATIVE mg/dL
SPECIFIC GRAVITY, URINE: 1.01 (ref 1.005–1.030)
pH: 5 (ref 5.0–8.0)

## 2016-08-04 LAB — CHLAMYDIA/NGC RT PCR (ARMC ONLY)
CHLAMYDIA TR: NOT DETECTED
N GONORRHOEAE: NOT DETECTED

## 2016-08-04 MED ORDER — KETOROLAC TROMETHAMINE 30 MG/ML IJ SOLN
30.0000 mg | Freq: Once | INTRAMUSCULAR | Status: AC
  Administered 2016-08-04: 30 mg via INTRAVENOUS
  Filled 2016-08-04: qty 1

## 2016-08-04 MED ORDER — FLUCONAZOLE 200 MG PO TABS: 200.0000 mg | ORAL_TABLET | Freq: Once | ORAL | 0 refills | Status: AC

## 2016-08-04 MED ORDER — FLUCONAZOLE 100 MG PO TABS
200.0000 mg | ORAL_TABLET | Freq: Once | ORAL | Status: AC
  Administered 2016-08-04: 200 mg via ORAL
  Filled 2016-08-04: qty 2

## 2016-08-04 MED ORDER — NITROFURANTOIN MONOHYD MACRO 100 MG PO CAPS: 100.0000 mg | ORAL_CAPSULE | Freq: Two times a day (BID) | ORAL | 0 refills | Status: AC

## 2016-08-04 MED ORDER — SODIUM CHLORIDE 0.9 % IV BOLUS (SEPSIS)
1000.0000 mL | Freq: Once | INTRAVENOUS | Status: AC
  Administered 2016-08-04: 1000 mL via INTRAVENOUS

## 2016-08-04 MED ORDER — NITROFURANTOIN MONOHYD MACRO 100 MG PO CAPS
100.0000 mg | ORAL_CAPSULE | Freq: Once | ORAL | Status: AC
  Administered 2016-08-04: 100 mg via ORAL
  Filled 2016-08-04: qty 1

## 2016-08-04 NOTE — ED Notes (Signed)
Pelvic exam completed. Assisted Dr. Don PerkingVeronese.

## 2016-08-04 NOTE — Discharge Instructions (Signed)

## 2016-08-04 NOTE — ED Triage Notes (Signed)
Pt in with co lower abd pain and low back pain. States started Monday and has had dysuria, does have hx of UTI's. Also co vaginal itching and burning, states has a yeast infection.

## 2016-08-04 NOTE — ED Provider Notes (Addendum)
[facility] Emergency Department Provider Note  ____________________________________________  Time seen: Approximately 5:23 AM  I have reviewed the triage vital signs and the nursing notes.   HISTORY  Chief Complaint Abdominal Pain   HPI Karina Ray is a 30 y.o. female history of recurrent UTIs and presents for evaluation of dysuria and suprapubic abdominal pain. Patient reports that her symptoms started on Monday. She has mild suprapubic pain, sharp, constant, non radiating and mild bilateral flank pain also since Monday. No nausea or vomiting. She has been having dysuria and vaginal discharge. No hematuria, fever, chills, N/V, diarrhea.  She is sexually active and does not use barrier protection. No prior h/o STIs.  No past medical history on file.  There are no active problems to display for this patient.   No past surgical history on file.  Prior to Admission medications   Medication Sig Start Date End Date Taking? Authorizing Provider  acetaminophen (TYLENOL) 500 MG tablet Take 1 tablet (500 mg total) by mouth every 6 (six) hours as needed. 07/20/14   Jennifer Piepenbrink, PA-C  fluconazole (DIFLUCAN) 150 MG tablet Take 1 tablet (150 mg total) by mouth once. 04/01/14   Reuben Likesavid C Keller, MD  ibuprofen (ADVIL,MOTRIN) 800 MG tablet Take 1 tablet (800 mg total) by mouth 3 (three) times daily. 07/20/14   Jennifer Piepenbrink, PA-C  lidocaine (XYLOCAINE) 2 % solution Use as directed 20 mLs in the mouth or throat as needed for mouth pain. 07/20/14   Jennifer Piepenbrink, PA-C  minocycline (MINOCIN,DYNACIN) 100 MG capsule Take 1 capsule (100 mg total) by mouth 2 (two) times daily. 03/24/14   Linna HoffJames D Kindl, MD  mupirocin cream (BACTROBAN) 2 % Apply 1 application topically 2 (two) times daily. 03/24/14   Linna HoffJames D Kindl, MD  mupirocin ointment Idelle Jo(BACTROBAN) 2 % Apply to both nostrils TID for 1 month 04/01/14   Reuben Likesavid C Keller, MD  nitrofurantoin, macrocrystal-monohydrate,  (MACROBID) 100 MG capsule Take 1 capsule (100 mg total) by mouth 2 (two) times daily. 08/04/16 08/09/16  Nita Sicklearolina Kiyon Fidalgo, MD    Allergies Review of patient's allergies indicates no known allergies.  No family history on file.  Social History Social History  Substance Use Topics  . Smoking status: Never Smoker  . Smokeless tobacco: Not on file  . Alcohol use No    Review of Systems  Constitutional: Negative for fever. Eyes: Negative for visual changes. ENT: Negative for sore throat. Cardiovascular: Negative for chest pain. Respiratory: Negative for shortness of breath. Gastrointestinal: + suprapubic abdominal pain. No vomiting or diarrhea. Genitourinary: + dysuria, frequency, and vaginal discharge Musculoskeletal: Negative for back pain. Skin: Negative for rash. Neurological: Negative for headaches, weakness or numbness.  ____________________________________________   PHYSICAL EXAM:  VITAL SIGNS: ED Triage Vitals [08/04/16 0429]  Enc Vitals Group     BP 95/79     Pulse Rate (!) 101     Resp 18     Temp 98.1 F (36.7 C)     Temp Source Oral     SpO2 100 %     Weight 140 lb (63.5 kg)     Height 5\' 3"  (1.6 m)     Head Circumference      Peak Flow      Pain Score 6     Pain Loc      Pain Edu?      Excl. in GC?     Constitutional: Alert and oriented. Well appearing and in no apparent distress. HEENT:  Head: Normocephalic and atraumatic.         Eyes: Conjunctivae are normal. Sclera is non-icteric. EOMI. PERRL      Mouth/Throat: Mucous membranes are moist.       Neck: Supple with no signs of meningismus. Cardiovascular: Tachycardic with regular rhythm. No murmurs, gallops, or rubs. 2+ symmetrical distal pulses are present in all extremities. No JVD. Respiratory: Normal respiratory effort. Lungs are clear to auscultation bilaterally. No wheezes, crackles, or rhonchi.  Gastrointestinal: Soft, mild suprapubic ttp, and non distended with positive bowel sounds.  No rebound or guarding. Genitourinary: No CVA tenderness. Pelvic exam: Normal external genitalia, no rashes or lesions. Thick white discharge. Os closed. No cervical motion tenderness.  No uterine or adnexal tenderness.   Musculoskeletal: Nontender with normal range of motion in all extremities. No edema, cyanosis, or erythema of extremities. Neurologic: Normal speech and language. Face is symmetric. Moving all extremities. No gross focal neurologic deficits are appreciated. Skin: Skin is warm, dry and intact. No rash noted. Psychiatric: Mood and affect are normal. Speech and behavior are normal.  ____________________________________________   LABS (all labs ordered are listed, but only abnormal results are displayed)  Labs Reviewed  WET PREP, GENITAL - Abnormal; Notable for the following:       Result Value   Yeast Wet Prep HPF POC PRESENT (*)    WBC, Wet Prep HPF POC MANY (*)    All other components within normal limits  URINALYSIS COMPLETEWITH MICROSCOPIC ([facility] ONLY) - Abnormal; Notable for the following:    Color, Urine YELLOW (*)    APPearance CLOUDY (*)    Leukocytes, UA 3+ (*)    Bacteria, UA RARE (*)    Squamous Epithelial / LPF 0-5 (*)    All other components within normal limits  CBC WITH DIFFERENTIAL/PLATELET - Abnormal; Notable for the following:    Hemoglobin 11.7 (*)    HCT 33.9 (*)    All other components within normal limits  BASIC METABOLIC PANEL - Abnormal; Notable for the following:    Glucose, Bld 116 (*)    Calcium 8.8 (*)    Anion gap 4 (*)    All other components within normal limits  URINE CULTURE  CHLAMYDIA/NGC RT PCR ([facility] ONLY)  POC URINE PREG, ED  POCT PREGNANCY, URINE   ____________________________________________  EKG  none  ____________________________________________  RADIOLOGY  none ____________________________________________   PROCEDURES  Procedure(s) performed: None Procedures Critical Care performed:   None ____________________________________________   INITIAL IMPRESSION / ASSESSMENT AND PLAN / ED COURSE  30 y.o. female history of recurrent UTIs and presents for evaluation of dysuria, frequency, vaginal discharge and suprapubic abdominal pain x 3 days. Patient well appearing, in no distress, she is afebrile and mildly tachycardic with heart rate of 101, her abdominal exam is benign with mild suprapubic tenderness, no flank tenderness, no right lower quadrant or left lower quadrant tenderness. We'll treat her pain with IV Toradol and give her IV fluids for her tachycardia. We'll check basic labs and do a pelvic exam with wet prep and gonorrhea chlamydia swabs.  Clinical Course   Wet prep Positive for yeast infection. Patient was treated with one time dose of fluconazole 200 mg by mouth. UA positive for urinary tract infection especially in the setting of symptoms. No evidence of pyelo or sepsis. Patient was given the first dose of Macrobid and is to be discharged home on a five-day course.Will provide prescription for fluconazole for a repeat dose after patient finished course of  abx and in case she develops a recurrent yeast infection. [facility]. Discussed return precautions with patient.  Pertinent labs & imaging results that were available during my care of the patient were reviewed by me and considered in my medical decision making (see chart for details).    ____________________________________________   FINAL CLINICAL IMPRESSION(S) / ED DIAGNOSES  Final diagnoses:  Acute cystitis without hematuria  Yeast infection of the vagina      NEW MEDICATIONS STARTED DURING THIS VISIT:  New Prescriptions   NITROFURANTOIN, MACROCRYSTAL-MONOHYDRATE, (MACROBID) 100 MG CAPSULE    Take 1 capsule (100 mg total) by mouth 2 (two) times daily.     Note:  This document was prepared using Dragon voice recognition software and may include  unintentional dictation errors.    Nita Sickle, MD 08/04/16 1610    Nita Sickle, MD 08/04/16 [id number]

## 2016-08-05 LAB — URINE CULTURE

## 2016-08-27 ENCOUNTER — Emergency Department
Admission: EM | Admit: 2016-08-27 | Discharge: 2016-08-27 | Disposition: A | Discharge status: Home / Self Care | Payer: BLUE CROSS/BLUE SHIELD | Attending: Emergency Medicine | Admitting: Emergency Medicine

## 2016-08-27 ENCOUNTER — Encounter: Payer: Self-pay | Admitting: Emergency Medicine

## 2016-08-27 DIAGNOSIS — B349 Viral infection, unspecified: Secondary | ICD-10-CM | POA: Insufficient documentation

## 2016-08-27 DIAGNOSIS — Z791 Long term (current) use of non-steroidal anti-inflammatories (NSAID): Secondary | ICD-10-CM | POA: Diagnosis not present

## 2016-08-27 DIAGNOSIS — R51 Headache: Secondary | ICD-10-CM | POA: Diagnosis present

## 2016-08-27 MED ORDER — IBUPROFEN 600 MG PO TABS: 600.0000 mg | ORAL_TABLET | Freq: Three times a day (TID) | ORAL | 0 refills | Status: DC | PRN

## 2016-08-27 MED ORDER — FEXOFENADINE-PSEUDOEPHED ER 60-120 MG PO TB12: 1.0000 | ORAL_TABLET | Freq: Two times a day (BID) | ORAL | 0 refills | Status: DC

## 2016-08-27 MED ORDER — HYDROCOD POLST-CPM POLST ER 10-8 MG/5ML PO SUER: 5.0000 mL | Freq: Two times a day (BID) | ORAL | 0 refills | Status: DC

## 2016-08-27 NOTE — ED Provider Notes (Signed)
[facility] Emergency Department Provider Note   ____________________________________________   First MD Initiated Contact with Patient 08/27/16 [phone]     (approximate)  I have reviewed the triage vital signs and the nursing notes.   HISTORY  Chief Complaint Influenza    HPI Karina Ray is a 30 y.o. female patient complaining of 6 days of generalized body aches, chills, ear pressure, runny nose and headache. Patient denies being) 106% sig. Patient has taken a flu shot in early October this year. Patient denies any nausea vomiting diarrhea with this complaint. No palliative measures taken for this complaint. Patient rates the pain as 8/10. She describes the pain as "achy".   History reviewed. No pertinent past medical history.  There are no active problems to display for this patient.   Past Surgical History:  Procedure Laterality Date  . CERVICAL CERCLAGE      Prior to Admission medications   Medication Sig Start Date End Date Taking? Authorizing Provider  acetaminophen (TYLENOL) 500 MG tablet Take 1 tablet (500 mg total) by mouth every 6 (six) hours as needed. 07/20/14   Jennifer Piepenbrink, PA-C  chlorpheniramine-HYDROcodone (TUSSIONEX PENNKINETIC ER) 10-8 MG/5ML SUER Take 5 mLs by mouth 2 (two) times daily. 08/27/16   Joni Reiningonald K Bellany Elbaum, PA-C  fexofenadine-pseudoephedrine (ALLEGRA-D) 60-120 MG 12 hr tablet Take 1 tablet by mouth 2 (two) times daily. 08/27/16   Joni Reiningonald K Shmiel Morton, PA-C  ibuprofen (ADVIL,MOTRIN) 600 MG tablet Take 1 tablet (600 mg total) by mouth every 8 (eight) hours as needed. 08/27/16   Joni Reiningonald K Kaceton Vieau, PA-C  ibuprofen (ADVIL,MOTRIN) 800 MG tablet Take 1 tablet (800 mg total) by mouth 3 (three) times daily. 07/20/14   Jennifer Piepenbrink, PA-C  lidocaine (XYLOCAINE) 2 % solution Use as directed 20 mLs in the mouth or throat as needed for mouth pain. 07/20/14   Jennifer Piepenbrink, PA-C  minocycline (MINOCIN,DYNACIN) 100 MG capsule Take 1  capsule (100 mg total) by mouth 2 (two) times daily. 03/24/14   Linna HoffJames D Kindl, MD  mupirocin cream (BACTROBAN) 2 % Apply 1 application topically 2 (two) times daily. 03/24/14   Linna HoffJames D Kindl, MD  mupirocin ointment Idelle Jo(BACTROBAN) 2 % Apply to both nostrils TID for 1 month 04/01/14   Reuben Likesavid C Keller, MD    Allergies Review of patient's allergies indicates no known allergies.  History reviewed. No pertinent family history.  Social History Social History  Substance Use Topics  . Smoking status: Never Smoker  . Smokeless tobacco: Never Used  . Alcohol use No    Review of Systems Constitutional: No fever. Chills and body aches Eyes: No visual changes. ENT: Sore throat. Nasal congestion runny nose. Cardiovascular: Denies chest pain. Respiratory: Denies shortness of breath. Nonproductive cough Gastrointestinal: No abdominal pain.  No nausea, no vomiting.  No diarrhea.  No constipation. Genitourinary: Negative for dysuria. Musculoskeletal: Negative for back pain. Skin: Negative for rash. Neurological: Negative for headaches, focal weakness or numbness.    ____________________________________________   PHYSICAL EXAM:  VITAL SIGNS: ED Triage Vitals  Enc Vitals Group     BP 08/27/16 0457 118/77     Pulse Rate 08/27/16 0457 87     Resp --      Temp 08/27/16 0457 97.5 F (36.4 C)     Temp Source 08/27/16 0457 Oral     SpO2 08/27/16 0457 100 %     Weight 08/27/16 0457 147 lb (66.7 kg)     Height 08/27/16 0457 5\' 3"  (1.6 m)  Head Circumference --      Peak Flow --      Pain Score 08/27/16 0458 8     Pain Loc --      Pain Edu? --      Excl. in [place]? --     Constitutional: Alert and oriented. Well appearing and in no acute distress. Appears malaise Eyes: Conjunctivae are normal. PERRL. EOMI. Head: Atraumatic. Nose: Edematous nasal turbinates clear rhinorrhea Mouth/Throat: Mucous membranes are moist.  Oropharynx non-erythematous. Postnasal drainage Neck: No stridor.  No cervical  spine tenderness to palpation. Hematological/Lymphatic/Immunilogical: No cervical lymphadenopathy. Cardiovascular: Normal rate, regular rhythm. Grossly normal heart sounds.  Good peripheral circulation. Respiratory: Normal respiratory effort.  No retractions. Lungs CTAB. Nonproductive cough Gastrointestinal: Soft and nontender. No distention. No abdominal bruits. No CVA tenderness. Genitourinary:  Musculoskeletal: No lower extremity tenderness nor edema.  No joint effusions. Neurologic:  Normal speech and language. No gross focal neurologic deficits are appreciated. No gait instability. Skin:  Skin is warm, dry and intact. No rash noted. Psychiatric: Mood and affect are normal. Speech and behavior are normal.  ____________________________________________   LABS (all labs ordered are listed, but only abnormal results are displayed)  Labs Reviewed - No data to display ____________________________________________  EKG   ____________________________________________  RADIOLOGY   ____________________________________________   PROCEDURES  Procedure(s) performed: None  Procedures  Critical Care performed: No  ____________________________________________   INITIAL IMPRESSION / ASSESSMENT AND PLAN / ED COURSE  Pertinent labs & imaging results that were available during my care of the patient were reviewed by me and considered in my medical decision making (see chart for details).  Viral illness. Patient given discharge Instructions. Patient given a prescription for Allegra-D, Tussionex, and ibuprofen. Patient given a work note for 2 days.  Clinical Course   Patient given Toradol IM  ____________________________________________   FINAL CLINICAL IMPRESSION(S) / ED DIAGNOSES  Final diagnoses:  Viral illness      NEW MEDICATIONS STARTED DURING THIS VISIT:  New Prescriptions   CHLORPHENIRAMINE-HYDROCODONE (TUSSIONEX PENNKINETIC ER) 10-8 MG/5ML SUER    Take 5 mLs by  mouth 2 (two) times daily.   FEXOFENADINE-PSEUDOEPHEDRINE (ALLEGRA-D) 60-120 MG 12 HR TABLET    Take 1 tablet by mouth 2 (two) times daily.   IBUPROFEN (ADVIL,MOTRIN) 600 MG TABLET    Take 1 tablet (600 mg total) by mouth every 8 (eight) hours as needed.     Note:  This document was prepared using Dragon voice recognition software and may include unintentional dictation errors.    Joni Reiningonald K Renne Cornick, PA-C 08/27/16 [phone]    Phineas SemenGraydon Goodman, MD 08/27/16 1340

## 2016-08-27 NOTE — ED Triage Notes (Signed)
Pt c/o generalized body aches, chills, ear pain, runny nose, and headache x 6 days. Pt has taken no meds for pain. Pt droves self to ED, today. Pt denies exposure to anyone with similar sxs. Pt has had flu shot this year.

## 2018-09-05 ENCOUNTER — Encounter (HOSPITAL_COMMUNITY): Payer: Self-pay | Admitting: Emergency Medicine

## 2018-09-05 ENCOUNTER — Other Ambulatory Visit: Payer: Self-pay

## 2018-09-05 ENCOUNTER — Ambulatory Visit (HOSPITAL_COMMUNITY)
Admission: EM | Admit: 2018-09-05 | Discharge: 2018-09-05 | Disposition: A | Discharge status: Home / Self Care | Payer: BLUE CROSS/BLUE SHIELD | Attending: Family Medicine | Admitting: Family Medicine

## 2018-09-05 DIAGNOSIS — J020 Streptococcal pharyngitis: Secondary | ICD-10-CM | POA: Diagnosis not present

## 2018-09-05 LAB — POCT RAPID STREP A: Streptococcus, Group A Screen (Direct): POSITIVE — AB

## 2018-09-05 MED ORDER — AMOXICILLIN 500 MG PO CAPS: 500.0000 mg | ORAL_CAPSULE | Freq: Two times a day (BID) | ORAL | 0 refills | Status: DC

## 2018-09-05 NOTE — ED Provider Notes (Signed)
[facility]    CSN: [id number] Arrival date & time: 09/05/18  0913     History   Chief Complaint Chief Complaint  Patient presents with  . Sore Throat    HPI Karina Ray is a 32 y.o. female.   32 year old female comes in for 2-day history of sore throat.  Denies rhinorrhea, nasal congestion, cough.  Denies fever, chills, night sweats.  Painful swallowing without swelling to throat, trouble breathing, tripoding, drooling, trismus.  Has not taken anything for the symptoms.     History reviewed. No pertinent past medical history.  There are no active problems to display for this patient.   Past Surgical History:  Procedure Laterality Date  . CERVICAL CERCLAGE      OB History   None      Home Medications    Prior to Admission medications   Medication Sig Start Date End Date Taking? Authorizing Provider  amoxicillin (AMOXIL) 500 MG capsule Take 1 capsule (500 mg total) by mouth 2 (two) times daily. 09/05/18   Belinda FisherYu, Kaylib Furness V, PA-C    Family History History reviewed. No pertinent family history.  Social History Social History   Tobacco Use  . Smoking status: Never Smoker  . Smokeless tobacco: Never Used  Substance Use Topics  . Alcohol use: No  . Drug use: No     Allergies   Patient has no known allergies.   Review of Systems Review of Systems  Reason unable to perform ROS: See HPI as above.     Physical Exam Triage Vital Signs ED Triage Vitals  Enc Vitals Group     BP 09/05/18 0944 118/69     Pulse Rate 09/05/18 0944 89     Resp --      Temp 09/05/18 0944 98.3 F (36.8 C)     Temp Source 09/05/18 0944 Oral     SpO2 09/05/18 0944 100 %     Weight --      Height --      Head Circumference --      Peak Flow --      Pain Score 09/05/18 0941 9     Pain Loc --      Pain Edu? --      Excl. in [place]? --    No data found.  Updated Vital Signs BP 118/69 (BP Location: Right Arm)   Pulse 89   Temp 98.3 F (36.8 C) (Oral)   LMP  09/03/2018 (Exact Date)   SpO2 100%   Visual Acuity Right Eye Distance:   Left Eye Distance:   Bilateral Distance:    Right Eye Near:   Left Eye Near:    Bilateral Near:     Physical Exam  Constitutional: She is oriented to person, place, and time. She appears well-developed and well-nourished. No distress.  HENT:  Head: Normocephalic and atraumatic.  Right Ear: Tympanic membrane, external ear and ear canal normal. Tympanic membrane is not erythematous and not bulging.  Left Ear: Tympanic membrane, external ear and ear canal normal. Tympanic membrane is not erythematous and not bulging.  Nose: Nose normal. Right sinus exhibits no maxillary sinus tenderness and no frontal sinus tenderness. Left sinus exhibits no maxillary sinus tenderness and no frontal sinus tenderness.  Mouth/Throat: Uvula is midline, oropharynx is clear and moist and mucous membranes are normal. Tonsils are 2+ on the right. Tonsils are 2+ on the left. Tonsillar exudate.  Eyes: Pupils are equal, round, and reactive to light. Conjunctivae are  normal.  Neck: Normal range of motion. Neck supple.  Cardiovascular: Normal rate, regular rhythm and normal heart sounds. Exam reveals no gallop and no friction rub.  No murmur heard. Pulmonary/Chest: Effort normal and breath sounds normal. She has no decreased breath sounds. She has no wheezes. She has no rhonchi. She has no rales.  Lymphadenopathy:    She has no cervical adenopathy.  Neurological: She is alert and oriented to person, place, and time.  Skin: Skin is warm and dry.  Psychiatric: She has a normal mood and affect. Her behavior is normal. Judgment normal.     UC Treatments / Results  Labs (all labs ordered are listed, but only abnormal results are displayed) Labs Reviewed  POCT RAPID STREP A - Abnormal; Notable for the following components:      Result Value   Streptococcus, Group A Screen (Direct) POSITIVE (*)    All other components within normal limits     EKG None  Radiology No results found.  Procedures Procedures (including critical care time)  Medications Ordered in UC Medications - No data to display  Initial Impression / Assessment and Plan / UC Course  I have reviewed the triage vital signs and the nursing notes.  Pertinent labs & imaging results that were available during my care of the patient were reviewed by me and considered in my medical decision making (see chart for details).    Rapid strep positive. Start antibiotic as directed. Symptomatic treatment as needed. Return precautions given.   Final Clinical Impressions(s) / UC Diagnoses   Final diagnoses:  Strep pharyngitis    ED Prescriptions    Medication Sig Dispense Auth. Provider   amoxicillin (AMOXIL) 500 MG capsule Take 1 capsule (500 mg total) by mouth 2 (two) times daily. 20 capsule Threasa Alpha, PA-C 09/05/18 1051

## 2018-09-05 NOTE — Discharge Instructions (Signed)
Rapid strep positive. Start amoxicillin as directed. Tylenol/Motrin for fever and pain. Monitor for any worsening of symptoms, trouble breathing, trouble swallowing, swelling of the throat, leaning forward to breath, drooling, follow up here or at the emergency department for reevaluation. ° °

## 2018-09-05 NOTE — ED Triage Notes (Signed)
Pt reports a sore throat that started yesterday.  She denies any nasal congestion or cough.  Pt drinking apple juice and eating a muffin during triage.

## 2019-12-05 ENCOUNTER — Other Ambulatory Visit: Payer: Self-pay

## 2019-12-05 ENCOUNTER — Emergency Department: Payer: Self-pay

## 2019-12-05 ENCOUNTER — Emergency Department
Admission: EM | Admit: 2019-12-05 | Discharge: 2019-12-05 | Disposition: A | Discharge status: Home / Self Care | Payer: Self-pay | Attending: Emergency Medicine | Admitting: Emergency Medicine

## 2019-12-05 ENCOUNTER — Encounter: Payer: Self-pay | Admitting: Emergency Medicine

## 2019-12-05 DIAGNOSIS — R110 Nausea: Secondary | ICD-10-CM | POA: Insufficient documentation

## 2019-12-05 DIAGNOSIS — B9689 Other specified bacterial agents as the cause of diseases classified elsewhere: Secondary | ICD-10-CM | POA: Insufficient documentation

## 2019-12-05 DIAGNOSIS — N760 Acute vaginitis: Secondary | ICD-10-CM | POA: Insufficient documentation

## 2019-12-05 DIAGNOSIS — R1030 Lower abdominal pain, unspecified: Secondary | ICD-10-CM

## 2019-12-05 LAB — CBC
HCT: 33.9 % — ABNORMAL LOW (ref 36.0–46.0)
Hemoglobin: 10.2 g/dL — ABNORMAL LOW (ref 12.0–15.0)
MCH: 22.6 pg — ABNORMAL LOW (ref 26.0–34.0)
MCHC: 30.1 g/dL (ref 30.0–36.0)
MCV: 75.2 fL — ABNORMAL LOW (ref 80.0–100.0)
Platelets: 329 10*3/uL (ref 150–400)
RBC: 4.51 MIL/uL (ref 3.87–5.11)
RDW: 19.3 % — ABNORMAL HIGH (ref 11.5–15.5)
WBC: 9 10*3/uL (ref 4.0–10.5)
nRBC: 0 % (ref 0.0–0.2)

## 2019-12-05 LAB — URINALYSIS, COMPLETE (UACMP) WITH MICROSCOPIC
Bacteria, UA: NONE SEEN
Bilirubin Urine: NEGATIVE
Glucose, UA: NEGATIVE mg/dL
Hgb urine dipstick: NEGATIVE
Ketones, ur: NEGATIVE mg/dL
Leukocytes,Ua: NEGATIVE
Nitrite: NEGATIVE
Protein, ur: NEGATIVE mg/dL
Specific Gravity, Urine: 1.005 (ref 1.005–1.030)
pH: 6 (ref 5.0–8.0)

## 2019-12-05 LAB — COMPREHENSIVE METABOLIC PANEL
ALT: 17 U/L (ref 0–44)
AST: 21 U/L (ref 15–41)
Albumin: 4 g/dL (ref 3.5–5.0)
Alkaline Phosphatase: 92 U/L (ref 38–126)
Anion gap: 10 (ref 5–15)
BUN: 8 mg/dL (ref 6–20)
CO2: 25 mmol/L (ref 22–32)
Calcium: 8.9 mg/dL (ref 8.9–10.3)
Chloride: 103 mmol/L (ref 98–111)
Creatinine, Ser: 0.61 mg/dL (ref 0.44–1.00)
GFR calc Af Amer: >60 mL/min (ref >60)
GFR calc non Af Amer: >60 mL/min (ref >60)
Glucose, Bld: 117 mg/dL — ABNORMAL HIGH (ref 70–99)
Potassium: 3.4 mmol/L — ABNORMAL LOW (ref 3.5–5.1)
Sodium: 138 mmol/L (ref 135–145)
Total Bilirubin: 0.4 mg/dL (ref 0.3–1.2)
Total Protein: 8.2 g/dL — ABNORMAL HIGH (ref 6.5–8.1)

## 2019-12-05 LAB — POC URINE PREG, ED: Preg Test, Ur: NEGATIVE

## 2019-12-05 LAB — WET PREP, GENITAL
Sperm: NONE SEEN
Trich, Wet Prep: NONE SEEN
Yeast Wet Prep HPF POC: NONE SEEN

## 2019-12-05 LAB — CHLAMYDIA/NGC RT PCR (ARMC ONLY)
Chlamydia Tr: NOT DETECTED
N gonorrhoeae: NOT DETECTED

## 2019-12-05 LAB — POCT PREGNANCY, URINE: Preg Test, Ur: NEGATIVE

## 2019-12-05 LAB — LIPASE, BLOOD: Lipase: 34 U/L (ref 11–51)

## 2019-12-05 MED ORDER — METRONIDAZOLE 500 MG PO TABS: 500.0000 mg | ORAL_TABLET | Freq: Two times a day (BID) | ORAL | 0 refills | Status: AC

## 2019-12-05 MED ORDER — FLUCONAZOLE 150 MG PO TABS: 150.0000 mg | ORAL_TABLET | Freq: Once | ORAL | 0 refills | Status: AC

## 2019-12-05 NOTE — ED Notes (Signed)
Pt alert, no apparent distress, pillow given.

## 2019-12-05 NOTE — ED Provider Notes (Signed)
[facility] Emergency Department Provider Note ____________________________________________   First MD Initiated Contact with Patient 12/05/19 0912     (approximate)  I have reviewed the triage vital signs and the nursing notes.   HISTORY  Chief Complaint Abdominal Pain    HPI Karina Ray is a 34 y.o. female with no significant PMH who presents with bilateral lower abdominal pain, described as crampy, and occurring intermittently over the last 2 weeks.  The patient states that the pain feels similar to period cramps, however she has not had a normal period this month or last month.  She is states that she has had 2 negative pregnancy tests at home.  She reports some nausea but denies vomiting or diarrhea.  She has no fever or chills, and no vaginal bleeding or discharge.  She denies dysuria.  She reports that she is expected with one partner.  History reviewed. No pertinent past medical history.  There are no problems to display for this patient.   Past Surgical History:  Procedure Laterality Date  . CERVICAL CERCLAGE      Prior to Admission medications   Medication Sig Start Date End Date Taking? Authorizing Provider  amoxicillin (AMOXIL) 500 MG capsule Take 1 capsule (500 mg total) by mouth 2 (two) times daily. 09/05/18   Tasia Catchings, Amy V, PA-C  metroNIDAZOLE (FLAGYL) 500 MG tablet Take 1 tablet (500 mg total) by mouth 2 (two) times daily for 7 days. 12/05/19 12/12/19  Arta Silence, MD    Allergies Patient has no known allergies.  No family history on file.  Social History Social History   Tobacco Use  . Smoking status: Never Smoker  . Smokeless tobacco: Never Used  Substance Use Topics  . Alcohol use: No  . Drug use: No    Review of Systems  Constitutional: No fever/chills. Eyes: No redness. ENT: No sore throat. Cardiovascular: Denies chest pain. Respiratory: Denies shortness of breath. Gastrointestinal: No vomiting or diarrhea.    Genitourinary: Negative for dysuria.  Musculoskeletal: Negative for back pain. Skin: Negative for rash. Neurological: Negative for headache.   ____________________________________________   PHYSICAL EXAM:  VITAL SIGNS: ED Triage Vitals  Enc Vitals Group     BP 12/05/19 0833 126/74     Pulse Rate 12/05/19 0833 88     Resp 12/05/19 0833 18     Temp 12/05/19 0833 99 F (37.2 C)     Temp Source 12/05/19 0833 Oral     SpO2 12/05/19 0833 96 %     Weight 12/05/19 0834 153 lb (69.4 kg)     Height 12/05/19 0834 5\' 3"  (1.6 m)     Head Circumference --      Peak Flow --      Pain Score 12/05/19 0834 8     Pain Loc --      Pain Edu? --      Excl. in [place]? --     Constitutional: Alert and oriented. Well appearing and in no acute distress. Eyes: Conjunctivae are normal.  Head: Atraumatic. Nose: No congestion/rhinnorhea. Mouth/Throat: Mucous membranes are moist.   Neck: Normal range of motion.  Cardiovascular:  Good peripheral circulation. Respiratory: Normal respiratory effort.  No retractions.  Gastrointestinal: Soft with mild bilateral lower abdominal discomfort but no focal tenderness.  No distention.  Genitourinary: Normal external genitalia.  No significant discharge.  No CMT or adnexal tenderness. Musculoskeletal: No lower extremity edema.  Extremities warm and well perfused.  Neurologic:  Normal speech and language.  No gross focal neurologic deficits are appreciated.  Skin:  Skin is warm and dry. No rash noted. Psychiatric: Mood and affect are normal. Speech and behavior are normal.  ____________________________________________   LABS (all labs ordered are listed, but only abnormal results are displayed)  Labs Reviewed  WET PREP, GENITAL - Abnormal; Notable for the following components:      Result Value   Clue Cells Wet Prep HPF POC PRESENT (*)    WBC, Wet Prep HPF POC FEW (*)    All other components within normal limits  COMPREHENSIVE METABOLIC PANEL - Abnormal;  Notable for the following components:   Potassium 3.4 (*)    Glucose, Bld 117 (*)    Total Protein 8.2 (*)    All other components within normal limits  CBC - Abnormal; Notable for the following components:   Hemoglobin 10.2 (*)    HCT 33.9 (*)    MCV 75.2 (*)    MCH 22.6 (*)    RDW 19.3 (*)    All other components within normal limits  URINALYSIS, COMPLETE (UACMP) WITH MICROSCOPIC - Abnormal; Notable for the following components:   Color, Urine STRAW (*)    APPearance HAZY (*)    All other components within normal limits  CHLAMYDIA/NGC RT PCR ([facility] ONLY)  LIPASE, BLOOD  POC URINE PREG, ED   ____________________________________________  EKG   ____________________________________________  RADIOLOGY  US pelvis: 1 cm fibroid with no other acute abnormalities.  ____________________________________________   PROCEDURES  Procedure(s) performed: No  Procedures  Critical Care performed: No ____________________________________________   INITIAL IMPRESSION / ASSESSMENT AND PLAN / ED COURSE  Pertinent labs & imaging results that were available during my care of the patient were reviewed by me and considered in my medical decision making (see chart for details).  34 year old female with no significant PMH presents with crampy bilateral lower abdominal pain over the last 2 weeks with some nausea but no other significant associated symptoms.  On exam, the patient is overall well-appearing.  Her vital signs are normal.  The abdomen is soft with no focal tenderness.  Pelvic exam reveals no significant abnormalities.   Initial lab work-up obtained from triage is unremarkable except for anemia, which appears chronic.  The urinalysis is negative as is the urine pregnancy.  Overall, presentation is most consistent with benign etiologies such as dysmenorrhea, mittelschmerz, BV, or ovarian cyst.  We will obtain an ultrasound and  reassess.  ----------------------------------------- 12:32 PM on 12/05/2019 -----------------------------------------  Ultrasound shows a small fibroid but no other acute abnormalities.  The wet prep reveals clue cells.  I will treat the patient empirically for BV.  At this time, patient is feeling well and is stable for discharge.  I counseled her on the results of the work-up.  I recommend that she follow-up with OB/GYN within the next 1 to 2 weeks.  Return precautions given, and she expresses understanding.  ____________________________________________   FINAL CLINICAL IMPRESSION(S) / ED DIAGNOSES  Final diagnoses:  Lower abdominal pain  Bacterial vaginosis      NEW MEDICATIONS STARTED DURING THIS VISIT:  New Prescriptions   METRONIDAZOLE (FLAGYL) 500 MG TABLET    Take 1 tablet (500 mg total) by mouth 2 (two) times daily for 7 days.     Note:  This document was prepared using Dragon voice recognition software and may include unintentional dictation errors.    Dionne Bucy, MD 12/05/19 [phone]

## 2019-12-05 NOTE — ED Triage Notes (Signed)
Patient to ER for c/o intermittent lower abd pain (suprapubic area) for approx 2 weeks. Denies any vomiting, diarrhea, or fever. +Nausea on Tuesday. Denies any urinary problems.

## 2019-12-05 NOTE — Discharge Instructions (Signed)
Take antibiotic as prescribed and finish the full course.  Follow-up with an OB/GYN within the next 1 to 2 weeks.  Return to the ER for new, worsening, or persistent severe abdominal pain, vaginal bleeding or discharge, fever, weakness, or any other new or worsening symptoms that concern you.

## 2020-01-08 ENCOUNTER — Emergency Department (HOSPITAL_COMMUNITY)
Admission: EM | Admit: 2020-01-08 | Discharge: 2020-01-09 | Disposition: A | Discharge status: Home / Self Care | Payer: No Typology Code available for payment source | Attending: Emergency Medicine | Admitting: Emergency Medicine

## 2020-01-08 ENCOUNTER — Encounter (HOSPITAL_COMMUNITY): Payer: Self-pay

## 2020-01-08 ENCOUNTER — Emergency Department (HOSPITAL_COMMUNITY): Payer: No Typology Code available for payment source

## 2020-01-08 DIAGNOSIS — Y92410 Unspecified street and highway as the place of occurrence of the external cause: Secondary | ICD-10-CM | POA: Diagnosis not present

## 2020-01-08 DIAGNOSIS — M25561 Pain in right knee: Secondary | ICD-10-CM

## 2020-01-08 DIAGNOSIS — Y999 Unspecified external cause status: Secondary | ICD-10-CM | POA: Insufficient documentation

## 2020-01-08 DIAGNOSIS — Z79899 Other long term (current) drug therapy: Secondary | ICD-10-CM | POA: Insufficient documentation

## 2020-01-08 DIAGNOSIS — Y93I9 Activity, other involving external motion: Secondary | ICD-10-CM | POA: Diagnosis not present

## 2020-01-08 DIAGNOSIS — M542 Cervicalgia: Secondary | ICD-10-CM | POA: Insufficient documentation
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
## 2020-01-09 MED ORDER — NAPROXEN 500 MG PO TABS: 500.0000 mg | ORAL_TABLET | Freq: Two times a day (BID) | ORAL | 0 refills | Status: DC

## 2020-01-09 MED ORDER — KETOROLAC TROMETHAMINE 60 MG/2ML IM SOLN
60.0000 mg | Freq: Once | INTRAMUSCULAR | Status: AC
  Administered 2020-01-09: 04:00:00 60 mg via INTRAMUSCULAR
  Filled 2020-01-09: qty 2

## 2020-01-09 MED ORDER — METHOCARBAMOL 500 MG PO TABS
500.0000 mg | ORAL_TABLET | Freq: Once | ORAL | Status: AC
  Administered 2020-01-09: 500 mg via ORAL
  Filled 2020-01-09: qty 1

## 2020-01-09 MED ORDER — METHOCARBAMOL 500 MG PO TABS: 500.0000 mg | ORAL_TABLET | Freq: Two times a day (BID) | ORAL | 0 refills | Status: DC
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
## 2020-03-17 ENCOUNTER — Ambulatory Visit (HOSPITAL_COMMUNITY)
Admission: EM | Admit: 2020-03-17 | Discharge: 2020-03-17 | Disposition: A | Discharge status: Home / Self Care | Payer: HRSA Program | Attending: Family Medicine | Admitting: Family Medicine

## 2020-03-17 ENCOUNTER — Encounter (HOSPITAL_COMMUNITY): Payer: Self-pay

## 2020-03-17 ENCOUNTER — Other Ambulatory Visit: Payer: Self-pay

## 2020-03-17 DIAGNOSIS — J069 Acute upper respiratory infection, unspecified: Secondary | ICD-10-CM

## 2020-03-17 DIAGNOSIS — Z79899 Other long term (current) drug therapy: Secondary | ICD-10-CM | POA: Diagnosis not present

## 2020-03-17 DIAGNOSIS — Z791 Long term (current) use of non-steroidal anti-inflammatories (NSAID): Secondary | ICD-10-CM | POA: Diagnosis not present

## 2020-03-17 DIAGNOSIS — Z20822 Contact with and (suspected) exposure to covid-19: Secondary | ICD-10-CM | POA: Insufficient documentation

## 2020-03-17 MED ORDER — AMOXICILLIN 500 MG PO CAPS: 500.0000 mg | ORAL_CAPSULE | Freq: Three times a day (TID) | ORAL | 0 refills | Status: DC

## 2020-03-17 MED ORDER — FLUTICASONE PROPIONATE 50 MCG/ACT NA SUSP: 2.0000 | Freq: Every day | NASAL | 2 refills | Status: DC

## 2020-03-17 NOTE — ED Triage Notes (Signed)
Pt presents with ongoing headache, generalized body aches, and sore throat since Saturday.

## 2020-03-17 NOTE — ED Provider Notes (Signed)
[facility]    CSN: [id number] Arrival date & time: 03/17/20  1621      History   Chief Complaint Chief Complaint  Patient presents with  . Headache  . Generalized Body Aches  . Sore Throat    HPI Karina Ray is a 34 y.o. female.   HPI Patient thinks she has a cold virus with sinus infection.  She does not think that she has Covid.  She has not been exposed to Covid.  She has not had a Covid vaccination.  Patient said headache, body aches, and sore throat since Saturday.  Some postnasal drip.  Some sinus pressure.  No loss of taste or smell.  No fever or chills.  No nausea or vomiting. She does have allergies  History reviewed. No pertinent past medical history.  There are no problems to display for this patient.   Past Surgical History:  Procedure Laterality Date  . CERVICAL CERCLAGE      OB History   No obstetric history on file.      Home Medications    Prior to Admission medications   Medication Sig Start Date End Date Taking? Authorizing Provider  amoxicillin (AMOXIL) 500 MG capsule Take 1 capsule (500 mg total) by mouth 3 (three) times daily. 03/17/20   Eustace Moore, MD  fluticasone [facility]) 50 MCG/ACT nasal spray Place 2 sprays into both nostrils daily. 03/17/20   Eustace Moore, MD  methocarbamol (ROBAXIN) 500 MG tablet Take 1 tablet (500 mg total) by mouth 2 (two) times daily. 01/09/20   Garlon Hatchet, PA-C  naproxen (NAPROSYN) 500 MG tablet Take 1 tablet (500 mg total) by mouth 2 (two) times daily with a meal. 01/09/20   Garlon Hatchet, PA-C    Family History Family History  Family history unknown: Yes    Social History Social History   Tobacco Use  . Smoking status: Never Smoker  . Smokeless tobacco: Never Used  Substance Use Topics  . Alcohol use: No  . Drug use: No     Allergies   Patient has no known allergies.   Review of Systems Review of Systems  Constitutional: Negative for chills and fever.  HENT:  Positive for congestion and sore throat.   Respiratory: Negative for cough and shortness of breath.   Gastrointestinal: Negative for nausea and vomiting.  Musculoskeletal: Positive for myalgias.  Neurological: Positive for headaches.     Physical Exam Triage Vital Signs ED Triage Vitals [03/17/20 1740]  Enc Vitals Group     BP 120/71     Pulse Rate 86     Resp 17     Temp 98.6 F (37 C)     Temp Source Oral     SpO2 100 %     Weight      Height      Head Circumference      Peak Flow      Pain Score 6     Pain Loc      Pain Edu?      Excl. in GC?    No data found.  Updated Vital Signs BP 120/71 (BP Location: Left Arm)   Pulse 86   Temp 98.6 F (37 C) (Oral)   Resp 17   LMP 02/08/2020   SpO2 100%      Physical Exam Constitutional:      General: She is not in acute distress.    Appearance: She is well-developed.  HENT:  Head: Normocephalic and atraumatic.     Right Ear: Tympanic membrane and ear canal normal.     Left Ear: Tympanic membrane and ear canal normal.     Nose: Congestion present.  Eyes:     Conjunctiva/sclera: Conjunctivae normal.     Pupils: Pupils are equal, round, and reactive to light.  Cardiovascular:     Rate and Rhythm: Normal rate and regular rhythm.     Heart sounds: Normal heart sounds.  Pulmonary:     Effort: Pulmonary effort is normal. No respiratory distress.     Breath sounds: Normal breath sounds.  Musculoskeletal:        General: Normal range of motion.     Cervical back: Normal range of motion.  Lymphadenopathy:     Cervical: No cervical adenopathy.  Skin:    General: Skin is warm and dry.  Neurological:     Mental Status: She is alert.  Psychiatric:        Mood and Affect: Mood normal.        Behavior: Behavior normal.      UC Treatments / Results  Labs (all labs ordered are listed, but only abnormal results are displayed) Labs Reviewed  SARS CORONAVIRUS 2 (TAT 6-24 HRS)    EKG   Radiology No results  found.  Procedures Procedures (including critical care time)  Medications Ordered in UC Medications - No data to display  Initial Impression / Assessment and Plan / UC Course  I have reviewed the triage vital signs and the nursing notes.  Pertinent labs & imaging results that were available during my care of the patient were reviewed by me and considered in my medical decision making (see chart for details).     Patient states that she usually gets sinus infections that require antibiotics.  I explained to her that per the CDC guidelines symptoms for 3 days did not qualify for antibiotics.  I did agree to give her a prescription for amoxicillin to fill and use if she continues to have symptoms longer than 7 days, or she has significant worsening of symptoms with purulent nasal discharge and fever.  She understands this is to be used only if she is Covid negative Final Clinical Impressions(s) / UC Diagnoses   Final diagnoses:  Viral upper respiratory tract infection     Discharge Instructions     Go home to rest Drink plenty of fluids Take Tylenol for pain or fever You may take over-the-counter cough and cold medicines as needed Add Flonase daily to try to help with the sinus congestion and drainage You may fill and take the amoxicillin if you fail to see improvement over the next few days You must quarantine at home until your test result is available You can check for your test result in MyChart    ED Prescriptions    Medication Sig Dispense Auth. Provider   amoxicillin (AMOXIL) 500 MG capsule Take 1 capsule (500 mg total) by mouth 3 (three) times daily. 21 capsule Raylene Everts, MD   fluticasone [facility]) 50 MCG/ACT nasal spray Place 2 sprays into both nostrils daily. 16 g Raylene Everts, MD     PDMP not reviewed this encounter.   Raylene Everts, MD 03/17/20 1816

## 2020-03-17 NOTE — Discharge Instructions (Signed)
Go home to rest Drink plenty of fluids Take Tylenol for pain or fever You may take over-the-counter cough and cold medicines as needed Add Flonase daily to try to help with the sinus congestion and drainage You may fill and take the amoxicillin if you fail to see improvement over the next few days You must quarantine at home until your test result is available You can check for your test result in MyChart

## 2020-03-18 LAB — SARS CORONAVIRUS 2 (TAT 6-24 HRS): SARS Coronavirus 2: NEGATIVE

## 2020-07-08 ENCOUNTER — Other Ambulatory Visit: Payer: Self-pay

## 2020-11-11 ENCOUNTER — Ambulatory Visit (HOSPITAL_COMMUNITY)
Admission: EM | Admit: 2020-11-11 | Discharge: 2020-11-11 | Disposition: A | Discharge status: Home / Self Care | Payer: HRSA Program | Attending: Emergency Medicine | Admitting: Emergency Medicine

## 2020-11-11 ENCOUNTER — Other Ambulatory Visit: Payer: Self-pay

## 2020-11-11 ENCOUNTER — Encounter (HOSPITAL_COMMUNITY): Payer: Self-pay | Admitting: Emergency Medicine

## 2020-11-11 DIAGNOSIS — R519 Headache, unspecified: Secondary | ICD-10-CM | POA: Diagnosis not present

## 2020-11-11 DIAGNOSIS — R059 Cough, unspecified: Secondary | ICD-10-CM | POA: Insufficient documentation

## 2020-11-11 DIAGNOSIS — U071 COVID-19: Secondary | ICD-10-CM | POA: Diagnosis not present

## 2020-11-11 DIAGNOSIS — J069 Acute upper respiratory infection, unspecified: Secondary | ICD-10-CM

## 2020-11-11 DIAGNOSIS — R197 Diarrhea, unspecified: Secondary | ICD-10-CM | POA: Diagnosis not present

## 2020-11-11 LAB — SARS CORONAVIRUS 2 (TAT 6-24 HRS): SARS Coronavirus 2: POSITIVE — AB

## 2020-11-11 NOTE — ED Provider Notes (Signed)
[facility]    CSN: [id number] Arrival date & time: 11/11/20  1030      History   Chief Complaint Chief Complaint  Patient presents with  . Headache  . Generalized Body Aches  . Sore Throat  . Chills    HPI Karina Ray is a 35 y.o. female.   Karina Ray presents with complaints of body aches, headache, chills, sore throat, cough, diarrhea, which started 1/15. Her boyfriend has similar, his symptoms started first. No shortness of breath . Has been taking over the counter medications which have provided minimal relief. No history of covid-19 and has not received vaccination.     ROS per HPI, negative if not otherwise mentioned.      History reviewed. No pertinent past medical history.  There are no problems to display for this patient.   Past Surgical History:  Procedure Laterality Date  . CERVICAL CERCLAGE      OB History   No obstetric history on file.      Home Medications    Prior to Admission medications   Medication Sig Start Date End Date Taking? Authorizing Provider  amoxicillin (AMOXIL) 500 MG capsule Take 1 capsule (500 mg total) by mouth 3 (three) times daily. 03/17/20   Eustace Moore, MD  fluticasone [facility]) 50 MCG/ACT nasal spray Place 2 sprays into both nostrils daily. 03/17/20   Eustace Moore, MD  methocarbamol (ROBAXIN) 500 MG tablet Take 1 tablet (500 mg total) by mouth 2 (two) times daily. 01/09/20   Garlon Hatchet, PA-C  naproxen (NAPROSYN) 500 MG tablet Take 1 tablet (500 mg total) by mouth 2 (two) times daily with a meal. 01/09/20   Garlon Hatchet, PA-C    Family History Family History  Family history unknown: Yes    Social History Social History   Tobacco Use  . Smoking status: Never Smoker  . Smokeless tobacco: Never Used  Substance Use Topics  . Alcohol use: No  . Drug use: No     Allergies   Patient has no known allergies.   Review of Systems Review of Systems   Physical Exam Triage  Vital Signs ED Triage Vitals  Enc Vitals Group     BP 11/11/20 1146 107/62     Pulse Rate 11/11/20 1146 97     Resp 11/11/20 1146 17     Temp 11/11/20 1146 99.7 F (37.6 C)     Temp Source 11/11/20 1146 Oral     SpO2 11/11/20 1146 100 %     Weight --      Height --      Head Circumference --      Peak Flow --      Pain Score 11/11/20 1144 9     Pain Loc --      Pain Edu? --      Excl. in GC? --    No data found.  Updated Vital Signs BP 107/62 (BP Location: Right Arm)   Pulse 97   Temp 99.7 F (37.6 C) (Oral)   Resp 17   LMP 11/10/2020   SpO2 100%   Visual Acuity Right Eye Distance:   Left Eye Distance:   Bilateral Distance:    Right Eye Near:   Left Eye Near:    Bilateral Near:     Physical Exam Constitutional:      General: She is not in acute distress.    Appearance: She is well-developed.  Cardiovascular:  Rate and Rhythm: Normal rate.  Pulmonary:     Effort: Pulmonary effort is normal.  Skin:    General: Skin is warm and dry.  Neurological:     Mental Status: She is alert and oriented to person, place, and time.      UC Treatments / Results  Labs (all labs ordered are listed, but only abnormal results are displayed) Labs Reviewed  SARS CORONAVIRUS 2 (TAT 6-24 HRS)    EKG   Radiology No results found.  Procedures Procedures (including critical care time)  Medications Ordered in UC Medications - No data to display  Initial Impression / Assessment and Plan / UC Course  I have reviewed the triage vital signs and the nursing notes.  Pertinent labs & imaging results that were available during my care of the patient were reviewed by me and considered in my medical decision making (see chart for details).     Non toxic. Benign physical exam.  History and physical consistent with viral illness.  Covid testing pending and isolation instructions provided.  Supportive cares recommended. Return precautions provided. Patient verbalized  understanding and agreeable to plan.   Final Clinical Impressions(s) / UC Diagnoses   Final diagnoses:  Upper respiratory tract infection, unspecified type     Discharge Instructions     Self isolate until covid results are back.  We will notify you by phone if it is positive. Your negative results will be sent through your MyChart.    If it is positive you need to isolate from others for a total of 5 days. If no fever for 24 hours without medications, and symptoms improving you may end isolation on day 6, but wear a mask if around any others for an additional 5 days.   Tylenol and/or ibuprofen as needed for pain or fevers.   Over the counter medications as needed for symptoms.  You may try some vitamins to help your immune system potentially:  Vitamin C 500mg  twice a day. Zinc 50mg  daily. Vitamin D 5000IU daily.   If symptoms worsen or do not improve in the next week to return to be seen or to follow up with your PCP.     ED Prescriptions    None     PDMP not reviewed this encounter.   , NP 11/11/20 1238

## 2020-11-11 NOTE — Discharge Instructions (Signed)
Self isolate until covid results are back.  We will notify you by phone if it is positive. Your negative results will be sent through your MyChart.    If it is positive you need to isolate from others for a total of 5 days. If no fever for 24 hours without medications, and symptoms improving you may end isolation on day 6, but wear a mask if around any others for an additional 5 days.   Tylenol and/or ibuprofen as needed for pain or fevers.   Over the counter medications as needed for symptoms.  You may try some vitamins to help your immune system potentially:  Vitamin C 500mg  twice a day. Zinc 50mg  daily. Vitamin D 5000IU daily.   If symptoms worsen or do not improve in the next week to return to be seen or to follow up with your PCP.

## 2020-11-11 NOTE — ED Triage Notes (Signed)
Pt presents with body aches, sore throat, headache, and cold chills xs 5 days. States has taken tylenol and OTC flu medication with some relief. States last dose of tylenol was last night.

## 2020-11-12 ENCOUNTER — Telehealth: Payer: Self-pay

## 2020-11-12 NOTE — Telephone Encounter (Signed)
Called to discuss with patient about COVID-19 symptoms and the use of one of the available treatments for those with mild to moderate Covid symptoms and at a high risk of hospitalization.  Pt appears to qualify for outpatient treatment due to co-morbid conditions and/or a member of an at-risk group in accordance with the FDA Emergency Use Authorization.    Symptom onset: 11/07/20 Vaccinated: No Booster? No Immunocompromised? No  Qualifiers: None  Unable to reach pt - No answer.  Esther Hardy

## 2021-02-06 ENCOUNTER — Other Ambulatory Visit: Payer: Self-pay

## 2021-02-06 ENCOUNTER — Observation Stay
Admission: EM | Admit: 2021-02-06 | Discharge: 2021-02-06 | Disposition: A | Discharge status: Home / Self Care | Payer: Self-pay | Attending: Obstetrics and Gynecology | Admitting: Obstetrics and Gynecology

## 2021-02-06 ENCOUNTER — Emergency Department: Payer: Self-pay

## 2021-02-06 DIAGNOSIS — N946 Dysmenorrhea, unspecified: Secondary | ICD-10-CM

## 2021-02-06 DIAGNOSIS — D62 Acute posthemorrhagic anemia: Secondary | ICD-10-CM | POA: Insufficient documentation

## 2021-02-06 DIAGNOSIS — Z20822 Contact with and (suspected) exposure to covid-19: Secondary | ICD-10-CM | POA: Insufficient documentation

## 2021-02-06 DIAGNOSIS — R1032 Left lower quadrant pain: Secondary | ICD-10-CM

## 2021-02-06 DIAGNOSIS — D649 Anemia, unspecified: Secondary | ICD-10-CM | POA: Diagnosis present

## 2021-02-06 DIAGNOSIS — N920 Excessive and frequent menstruation with regular cycle: Principal | ICD-10-CM | POA: Insufficient documentation

## 2021-02-06 HISTORY — DX: Unspecified abnormal cytological findings in specimens from cervix uteri: R87.619

## 2021-02-06 LAB — WET PREP, GENITAL
Clue Cells Wet Prep HPF POC: NONE SEEN
Sperm: NONE SEEN
Trich, Wet Prep: NONE SEEN
Yeast Wet Prep HPF POC: NONE SEEN

## 2021-02-06 LAB — URINALYSIS, COMPLETE (UACMP) WITH MICROSCOPIC
Bacteria, UA: NONE SEEN
Bilirubin Urine: NEGATIVE
Glucose, UA: NEGATIVE mg/dL
Ketones, ur: 5 mg/dL — AB
Leukocytes,Ua: NEGATIVE
Nitrite: NEGATIVE
Protein, ur: 100 mg/dL — AB
RBC / HPF: >50 RBC/hpf — ABNORMAL HIGH (ref 0–5)
Specific Gravity, Urine: 1.025 (ref 1.005–1.030)
Squamous Epithelial / HPF: NONE SEEN (ref 0–5)
WBC, UA: NONE SEEN WBC/hpf (ref 0–5)
pH: 5 (ref 5.0–8.0)

## 2021-02-06 LAB — PROTIME-INR
INR: 1.1 (ref 0.8–1.2)
Prothrombin Time: 14.2 seconds (ref 11.4–15.2)

## 2021-02-06 LAB — COMPREHENSIVE METABOLIC PANEL
ALT: 16 U/L (ref 0–44)
AST: 19 U/L (ref 15–41)
Albumin: 3.6 g/dL (ref 3.5–5.0)
Alkaline Phosphatase: 78 U/L (ref 38–126)
Anion gap: 6 (ref 5–15)
BUN: 10 mg/dL (ref 6–20)
CO2: 24 mmol/L (ref 22–32)
Calcium: 8.6 mg/dL — ABNORMAL LOW (ref 8.9–10.3)
Chloride: 109 mmol/L (ref 98–111)
Creatinine, Ser: 0.64 mg/dL (ref 0.44–1.00)
GFR, Estimated: >60 mL/min (ref >60)
Glucose, Bld: 108 mg/dL — ABNORMAL HIGH (ref 70–99)
Potassium: 4 mmol/L (ref 3.5–5.1)
Sodium: 139 mmol/L (ref 135–145)
Total Bilirubin: 0.3 mg/dL (ref 0.3–1.2)
Total Protein: 7.4 g/dL (ref 6.5–8.1)

## 2021-02-06 LAB — LIPASE, BLOOD: Lipase: 37 U/L (ref 11–51)

## 2021-02-06 LAB — CBC
HCT: 24.6 % — ABNORMAL LOW (ref 36.0–46.0)
Hemoglobin: 6.7 g/dL — ABNORMAL LOW (ref 12.0–15.0)
MCH: 18.5 pg — ABNORMAL LOW (ref 26.0–34.0)
MCHC: 27.2 g/dL — ABNORMAL LOW (ref 30.0–36.0)
MCV: 68 fL — ABNORMAL LOW (ref 80.0–100.0)
Platelets: 389 10*3/uL (ref 150–400)
RBC: 3.62 MIL/uL — ABNORMAL LOW (ref 3.87–5.11)
RDW: 25 % — ABNORMAL HIGH (ref 11.5–15.5)
WBC: 7.1 10*3/uL (ref 4.0–10.5)

## 2021-02-06 LAB — RESP PANEL BY RT-PCR (FLU A&B, COVID) ARPGX2
Influenza A by PCR: NEGATIVE
Influenza B by PCR: NEGATIVE
SARS Coronavirus 2 by RT PCR: NEGATIVE

## 2021-02-06 LAB — CHLAMYDIA/NGC RT PCR (ARMC ONLY)
Chlamydia Tr: NOT DETECTED
N gonorrhoeae: NOT DETECTED

## 2021-02-06 LAB — PREPARE RBC (CROSSMATCH)

## 2021-02-06 LAB — HEMOGLOBIN AND HEMATOCRIT, BLOOD
HCT: 33 % — ABNORMAL LOW (ref 36.0–46.0)
Hemoglobin: 9.9 g/dL — ABNORMAL LOW (ref 12.0–15.0)

## 2021-02-06 LAB — ABO/RH: ABO/RH(D): O POS

## 2021-02-06 LAB — POC URINE PREG, ED: Preg Test, Ur: NEGATIVE

## 2021-02-06 MED ORDER — ONDANSETRON HCL 4 MG PO TABS
4.0000 mg | ORAL_TABLET | Freq: Four times a day (QID) | ORAL | Status: DC | PRN
  Administered 2021-02-06: 4 mg via ORAL
  Filled 2021-02-06: qty 1

## 2021-02-06 MED ORDER — MAGNESIUM CITRATE PO SOLN
1.0000 | Freq: Once | ORAL | Status: DC | PRN
  Filled 2021-02-06: qty 296

## 2021-02-06 MED ORDER — ZOLPIDEM TARTRATE 5 MG PO TABS: 5.0000 mg | ORAL_TABLET | Freq: Every evening | ORAL | Status: DC | PRN

## 2021-02-06 MED ORDER — ONDANSETRON HCL 4 MG/2ML IJ SOLN
4.0000 mg | Freq: Four times a day (QID) | INTRAMUSCULAR | Status: DC | PRN
  Filled 2021-02-06: qty 2

## 2021-02-06 MED ORDER — MAGNESIUM HYDROXIDE 400 MG/5ML PO SUSP: 30.0000 mL | Freq: Every day | ORAL | Status: DC | PRN

## 2021-02-06 MED ORDER — DIPHENHYDRAMINE HCL 25 MG PO CAPS
25.0000 mg | ORAL_CAPSULE | Freq: Once | ORAL | Status: AC
  Administered 2021-02-06: 25 mg via ORAL
  Filled 2021-02-06: qty 1

## 2021-02-06 MED ORDER — IBUPROFEN 600 MG PO TABS: 600.0000 mg | ORAL_TABLET | Freq: Four times a day (QID) | ORAL | Status: DC | PRN

## 2021-02-06 MED ORDER — OXYCODONE-ACETAMINOPHEN 5-325 MG PO TABS: 1.0000 | ORAL_TABLET | ORAL | Status: DC | PRN

## 2021-02-06 MED ORDER — NORGESTIMATE-ETH ESTRADIOL 0.25-35 MG-MCG PO TABS: ORAL_TABLET | ORAL | 1 refills | Status: DC

## 2021-02-06 MED ORDER — ONDANSETRON HCL 4 MG PO TABS: 4.0000 mg | ORAL_TABLET | Freq: Three times a day (TID) | ORAL | 1 refills | Status: AC | PRN

## 2021-02-06 MED ORDER — KETOROLAC TROMETHAMINE 30 MG/ML IJ SOLN: 60.0000 mg | Freq: Once | INTRAMUSCULAR | Status: AC

## 2021-02-06 MED ORDER — KETOROLAC TROMETHAMINE 30 MG/ML IJ SOLN
30.0000 mg | Freq: Once | INTRAMUSCULAR | Status: DC
  Filled 2021-02-06: qty 1

## 2021-02-06 MED ORDER — ALUM & MAG HYDROXIDE-SIMETH 200-200-20 MG/5ML PO SUSP: 30.0000 mL | ORAL | Status: DC | PRN

## 2021-02-06 MED ORDER — BISACODYL 5 MG PO TBEC
5.0000 mg | DELAYED_RELEASE_TABLET | Freq: Every day | ORAL | Status: DC | PRN
  Filled 2021-02-06: qty 1

## 2021-02-06 MED ORDER — PROMETHAZINE HCL 25 MG RE SUPP
25.0000 mg | Freq: Four times a day (QID) | RECTAL | Status: DC | PRN
  Filled 2021-02-06: qty 1

## 2021-02-06 MED ORDER — SODIUM CHLORIDE 0.9 % IV SOLN: Freq: Once | INTRAVENOUS | Status: DC

## 2021-02-06 MED ORDER — DOCUSATE SODIUM 100 MG PO CAPS
100.0000 mg | ORAL_CAPSULE | Freq: Two times a day (BID) | ORAL | Status: DC
  Administered 2021-02-06: 100 mg via ORAL
  Filled 2021-02-06: qty 1

## 2021-02-06 MED ORDER — ACETAMINOPHEN 325 MG PO TABS
650.0000 mg | ORAL_TABLET | Freq: Once | ORAL | Status: AC
  Administered 2021-02-06: 650 mg via ORAL
  Filled 2021-02-06: qty 2

## 2021-02-06 MED ORDER — SODIUM CHLORIDE 0.9 % IV SOLN: 10.0000 mL/h | Freq: Once | INTRAVENOUS | Status: DC

## 2021-02-06 MED ORDER — KETOROLAC TROMETHAMINE 30 MG/ML IJ SOLN
INTRAMUSCULAR | Status: AC
  Administered 2021-02-06: 60 mg via INTRAMUSCULAR
  Filled 2021-02-06: qty 1

## 2021-02-06 MED ORDER — ESTROGENS CONJUGATED 0.625 MG PO TABS
2.5000 mg | ORAL_TABLET | Freq: Four times a day (QID) | ORAL | Status: DC
  Administered 2021-02-06: 2.5 mg via ORAL
  Filled 2021-02-06 (×3): qty 4
  Filled 2021-02-06 (×4): qty 2

## 2021-02-06 NOTE — ED Provider Notes (Signed)
[facility] Emergency Department Provider Note  ____________________________________________   Event Date/Time   First MD Initiated Contact with Patient 02/06/21 [phone]     (approximate)  I have reviewed the triage vital signs and the nursing notes.   HISTORY  Chief Complaint Abdominal Pain    HPI [name] is a 35 y.o. female with complaints of left lower quadrant sharp, crampy abdominal pain that started on Wednesday the 13th and has progressively worsened.  Patient states that she started her menstrual cycle on Wednesday and thought that the cramping may be secondary to that but pain is worse than she normally has and states bleeding is heavier and she is passing clots.  No fevers, nausea, vomiting, diarrhea, dysuria, hematuria, vaginal discharge.  She is not concerned for STDs or pregnancy at this time.  Has been taking Tylenol and ibuprofen at home without much relief.  She states that her menstrual cycles used to be very irregular and last for 2 weeks with heavy bleeding.  She states since November 2021 her menstrual cycles are regular and last about 5 days.  She states she used to have weakness and shortness of breath with ambulation but denies this currently.  No lightheadedness, chest pain.        History reviewed. No pertinent past medical history.  There are no problems to display for this patient.   Past Surgical History:  Procedure Laterality Date  . CERVICAL CERCLAGE      Prior to Admission medications   Medication Sig Start Date End Date Taking? Authorizing Provider  amoxicillin (AMOXIL) 500 MG capsule Take 1 capsule (500 mg total) by mouth 3 (three) times daily. 03/17/20   Eustace Moore, MD  fluticasone [facility]) 50 MCG/ACT nasal spray Place 2 sprays into both nostrils daily. 03/17/20   Eustace Moore, MD  methocarbamol (ROBAXIN) 500 MG tablet Take 1 tablet (500 mg total) by mouth 2 (two) times daily. 01/09/20   Garlon Hatchet, PA-C  naproxen (NAPROSYN) 500 MG tablet Take 1 tablet (500 mg total) by mouth 2 (two) times daily with a meal. 01/09/20   Garlon Hatchet, PA-C    Allergies Patient has no known allergies.  Family History  Family history unknown: Yes    Social History Social History   Tobacco Use  . Smoking status: Never Smoker  . Smokeless tobacco: Never Used  Substance Use Topics  . Alcohol use: No  . Drug use: No    Review of Systems Constitutional: No fever. Eyes: No visual changes. ENT: No sore throat. Cardiovascular: Denies chest pain. Respiratory: Denies shortness of breath. Gastrointestinal: No nausea, vomiting, diarrhea. Genitourinary: Negative for dysuria. Musculoskeletal: Negative for back pain. Skin: Negative for rash. Neurological: Negative for focal weakness or numbness.  ____________________________________________   PHYSICAL EXAM:  VITAL SIGNS: ED Triage Vitals  Enc Vitals Group     BP 02/06/21 0128 116/82     Pulse Rate 02/06/21 0128 95     Resp 02/06/21 0128 16     Temp 02/06/21 0128 97.8 F (36.6 C)     Temp Source 02/06/21 0128 Oral     SpO2 02/06/21 0128 99 %     Weight 02/06/21 0129 153 lb (69.4 kg)     Height 02/06/21 0129 5\' 3"  (1.6 m)     Head Circumference --      Peak Flow --      Pain Score 02/06/21 0128 9     Pain Loc --  Pain Edu? --      Excl. in GC? --    CONSTITUTIONAL: Alert and oriented and responds appropriately to questions. Well-appearing; well-nourished, afebrile, nontoxic HEAD: Normocephalic EYES: Conjunctivae clear, pupils appear equal, EOM appear intact ENT: normal nose; moist mucous membranes NECK: Supple, normal ROM CARD: RRR; S1 and S2 appreciated; no murmurs, no clicks, no rubs, no gallops RESP: Normal chest excursion without splinting or tachypnea; breath sounds clear and equal bilaterally; no wheezes, no rhonchi, no rales, no hypoxia or respiratory distress, speaking full sentences ABD/GI: Normal bowel sounds;  non-distended; soft, tender to palpation in the left pelvic region no rebound, no guarding, no peritoneal signs, no hepatosplenomegaly GU:  Normal external genitalia. No lesions, rashes noted. Patient has moderate amount of dark red vaginal bleeding on exam numbing from the cervical os with no clots.  No vaginal discharge.  Cervix is closed.  Chaperone present for exam. BACK: The back appears normal EXT: Normal ROM in all joints; no deformity noted, no edema; no cyanosis SKIN: Normal color for age and race; warm; no rash on exposed skin NEURO: Moves all extremities equally PSYCH: The patient's mood and manner are appropriate.  ____________________________________________   LABS (all labs ordered are listed, but only abnormal results are displayed)  Labs Reviewed  COMPREHENSIVE METABOLIC PANEL - Abnormal; Notable for the following components:      Result Value   Glucose, Bld 108 (*)    Calcium 8.6 (*)    All other components within normal limits  CBC - Abnormal; Notable for the following components:   RBC 3.62 (*)    Hemoglobin 6.7 (*)    HCT 24.6 (*)    MCV 68.0 (*)    MCH 18.5 (*)    MCHC 27.2 (*)    RDW 25.0 (*)    All other components within normal limits  URINALYSIS, COMPLETE (UACMP) WITH MICROSCOPIC - Abnormal; Notable for the following components:   Color, Urine AMBER (*)    APPearance CLOUDY (*)    Hgb urine dipstick LARGE (*)    Ketones, ur 5 (*)    Protein, ur 100 (*)    RBC / HPF >50 (*)    All other components within normal limits  WET PREP, GENITAL  CHLAMYDIA/NGC RT PCR ([facility] ONLY)  RESP PANEL BY RT-PCR (FLU A&B, COVID) ARPGX2  LIPASE, BLOOD  PROTIME-INR  POC URINE PREG, ED  TYPE AND SCREEN  PREPARE RBC (CROSSMATCH)   ____________________________________________  EKG   ____________________________________________  RADIOLOGY I, Arias Weinert, personally viewed and evaluated these images (plain radiographs) as part of my medical decision making,  as well as reviewing the written report by the radiologist.  ED MD interpretation: Ultrasound unremarkable.  Official radiology report(s): US PELVIS (TRANSABDOMINAL ONLY)  Result Date: 02/06/2021 CLINICAL DATA:  Left lower quadrant pain for 4 days EXAM: TRANSABDOMINAL ULTRASOUND OF PELVIS DOPPLER ULTRASOUND OF OVARIES TECHNIQUE: Transabdominal ultrasound examination of the pelvis was performed including evaluation of the uterus, ovaries, adnexal regions, and pelvic cul-de-sac. Color and duplex Doppler ultrasound was utilized to evaluate blood flow to the ovaries. COMPARISON:  Pelvic ultrasound 12/05/2019 FINDINGS: Uterus Measurements: 7.8 x 3.7 x 4.9 cm = volume: 73.5 mL. Anteverted uterus. No concerning uterine masses or discernible fibroids are identified. A fundal fibroid seen on comparison ultrasound is less well visualized on transabdominal only technique. Endometrium Thickness: 3.8 mm, non thickened.  No focal abnormality visualized. Right ovary Measurements: 3.7 x 1.8 x 2.4 cm = volume: 8.3 mL. Normal appearance/no adnexal mass.  Left ovary Measurements: 4.9 x 2.1 x 2.0 cm = volume: 14.4 mL. Normal appearance/no adnexal mass. Pulsed Doppler evaluation demonstrates normal low-resistance arterial and venous waveforms in both ovaries. Other: Patient declined transvaginal scanning technique. IMPRESSION: 1. Unremarkable pelvic ultrasound. No evidence of ovarian torsion or other acute pelvic abnormality. 2. Previously seen fundal fibroid less well visualized on today's transabdominal technique. 3. Patient declined transvaginal scanning technique. Electronically Signed   By: Kreg Shropshire M.D.   On: 02/06/2021 03:14   US PELVIC DOPPLER (TORSION R/O OR MASS ARTERIAL FLOW)  Result Date: 02/06/2021 CLINICAL DATA:  Left lower quadrant pain for 4 days EXAM: TRANSABDOMINAL ULTRASOUND OF PELVIS DOPPLER ULTRASOUND OF OVARIES TECHNIQUE: Transabdominal ultrasound examination of the pelvis was performed including  evaluation of the uterus, ovaries, adnexal regions, and pelvic cul-de-sac. Color and duplex Doppler ultrasound was utilized to evaluate blood flow to the ovaries. COMPARISON:  Pelvic ultrasound 12/05/2019 FINDINGS: Uterus Measurements: 7.8 x 3.7 x 4.9 cm = volume: 73.5 mL. Anteverted uterus. No concerning uterine masses or discernible fibroids are identified. A fundal fibroid seen on comparison ultrasound is less well visualized on transabdominal only technique. Endometrium Thickness: 3.8 mm, non thickened.  No focal abnormality visualized. Right ovary Measurements: 3.7 x 1.8 x 2.4 cm = volume: 8.3 mL. Normal appearance/no adnexal mass. Left ovary Measurements: 4.9 x 2.1 x 2.0 cm = volume: 14.4 mL. Normal appearance/no adnexal mass. Pulsed Doppler evaluation demonstrates normal low-resistance arterial and venous waveforms in both ovaries. Other: Patient declined transvaginal scanning technique. IMPRESSION: 1. Unremarkable pelvic ultrasound. No evidence of ovarian torsion or other acute pelvic abnormality. 2. Previously seen fundal fibroid less well visualized on today's transabdominal technique. 3. Patient declined transvaginal scanning technique. Electronically Signed   By: Kreg Shropshire M.D.   On: 02/06/2021 03:14    ____________________________________________   PROCEDURES  Procedure(s) performed (including Critical Care):  Procedures  CRITICAL CARE Performed by: Baxter Hire Elira Colasanti   Total critical care time: 65 minutes  Critical care time was exclusive of separately billable procedures and treating other patients.  Critical care was necessary to treat or prevent imminent or life-threatening deterioration.  Critical care was time spent personally by me on the following activities: development of treatment plan with patient and/or surrogate as well as nursing, discussions with consultants, evaluation of patient's response to treatment, examination of patient, obtaining history from patient or  surrogate, ordering and performing treatments and interventions, ordering and review of laboratory studies, ordering and review of radiographic studies, pulse oximetry and re-evaluation of patient's condition.  ____________________________________________   INITIAL IMPRESSION / ASSESSMENT AND PLAN / ED COURSE  As part of my medical decision making, I reviewed the following data within the electronic MEDICAL RECORD NUMBER Nursing notes reviewed and incorporated, Labs reviewed , Old chart reviewed, Discussed with admitting physician  and Notes from prior ED visits         Patient with complaints of dysmenorrhea.  She does state it is the normal time for her menstrual cycle but states her bleeding is heavier than normal and her pain is worse than normal.  Pain mostly localized into the left pelvic region.  Patient is currently hemodynamically stable.  Differential includes dysmenorrhea, PID, TOA, ectopic, torsion, UTI, kidney stone, diverticulitis.  Labs, urine obtained in triage and are pending.  Will give Toradol for pain.  Will obtain transvaginal ultrasound with Doppler.  ED PROGRESS  Patient's ultrasound shows normal blood flow to both ovaries.  The previously seen fundal fibroid that she has had  on previous ultrasound on 12/05/2019 is no longer visualized.  She does have some bleeding on pelvic exam but is not hemorrhaging.  Hemodynamically stable.  Hemoglobin has come back at 6.7 which I think is likely due to heavy menstrual cycles.  I discussed risk and benefits of transfusion.  Will order 2 units of packed red blood cells and discussed with hospitalist for admission for further monitoring.  4:24 AM  D/w Dr. Arville Care with hospitalist service who recommends discussion with GYN for potential admission by their service.  4:29 AM Discussed patient's case with OBGYN, Dr. Dalbert Garnet.  I have recommended admission and patient (and family if present) agree with this plan. Admitting physician will place  admission orders.   I reviewed all nursing notes, vitals, pertinent previous records and reviewed/interpreted all EKGs, lab and urine results, imaging (as available).   ____________________________________________   FINAL CLINICAL IMPRESSION(S) / ED DIAGNOSES  Final diagnoses:  Dysmenorrhea  Symptomatic anemia     ED Discharge Orders    None      *Please note:  Karina Ray was evaluated in Emergency Department on 02/06/2021 for the symptoms described in the history of present illness. She was evaluated in the context of the global COVID-19 pandemic, which necessitated consideration that the patient might be at risk for infection with the SARS-CoV-2 virus that causes COVID-19. Institutional protocols and algorithms that pertain to the evaluation of patients at risk for COVID-19 are in a state of rapid change based on information released by regulatory bodies including the CDC and federal and state organizations. These policies and algorithms were followed during the patient's care in the ED.  Some ED evaluations and interventions may be delayed as a result of limited staffing during and the pandemic.*   Note:  This document was prepared using Dragon voice recognition software and may include unintentional dictation errors.   Kebin Maye, Layla Maw, DO 02/06/21 [phone]

## 2021-02-06 NOTE — ED Triage Notes (Signed)
Pt presents to ER c/o LLQ abdominal cramping that started Wednesday.  Pt states she started her period on Wednesday as well, and thought it might be cramping from that, but the pain has been much more persistent.  Pt states pain is cramping in nature.

## 2021-02-06 NOTE — Progress Notes (Signed)
Obstetric and Gynecology  Subjective  Karina Ray is a 35 y.o. female [id number] who presented on 02/06/2021 for menorrhagia.  Currently receiving 1st unit of pRBC's, 2nd unit pending.  She denies feeling vertigo, SOB, or fatigue.  Reports vaginal bleeding has improved.    Objective   Vitals:   02/06/21 0831 02/06/21 0852  BP: 119/76 129/87  Pulse: 72 82  Resp: 18 18  Temp: 98.4 F (36.9 C) 98.2 F (36.8 C)  SpO2: 100% 100%     Intake/Output Summary (Last 24 hours) at 02/06/2021 1039 Last data filed at 02/06/2021 0852 Gross per 24 hour  Intake 38.5 ml  Output --  Net 38.5 ml    General: NAD Cardiovascular: RRR, no murmurs Pulmonary: CTAB Abdomen: Benign. Non-tender, +BS, no guarding. Extremities: No erythema or cords, no calf tenderness, +warmth with normal peripheral pulses.  Labs: Results for orders placed or performed during the hospital encounter of 02/06/21 (from the past 24 hour(s))  Lipase, blood     Status: None   Collection Time: 02/06/21  1:30 AM  Result Value Ref Range   Lipase 37 11 - 51 U/L  Comprehensive metabolic panel     Status: Abnormal   Collection Time: 02/06/21  1:30 AM  Result Value Ref Range   Sodium 139 135 - 145 mmol/L   Potassium 4.0 3.5 - 5.1 mmol/L   Chloride 109 98 - 111 mmol/L   CO2 24 22 - 32 mmol/L   Glucose, Bld 108 (H) 70 - 99 mg/dL   BUN 10 6 - 20 mg/dL   Creatinine, Ser 9.81 0.44 - 1.00 mg/dL   Calcium 8.6 (L) 8.9 - 10.3 mg/dL   Total Protein 7.4 6.5 - 8.1 g/dL   Albumin 3.6 3.5 - 5.0 g/dL   AST 19 15 - 41 U/L   ALT 16 0 - 44 U/L   Alkaline Phosphatase 78 38 - 126 U/L   Total Bilirubin 0.3 0.3 - 1.2 mg/dL   GFR, Estimated >19 >14 mL/min   Anion gap 6 5 - 15  CBC     Status: Abnormal   Collection Time: 02/06/21  1:30 AM  Result Value Ref Range   WBC 7.1 4.0 - 10.5 K/uL   RBC 3.62 (L) 3.87 - 5.11 MIL/uL   Hemoglobin 6.7 (L) 12.0 - 15.0 g/dL   HCT 78.2 (L) 95.6 - 21.3 %   MCV 68.0 (L) 80.0 - 100.0 fL   MCH 18.5 (L) 26.0 -  34.0 pg   MCHC 27.2 (L) 30.0 - 36.0 g/dL   RDW 08.6 (H) 57.8 - 46.9 %   Platelets 389 150 - 400 K/uL   nRBC Not Measured 0.0 - 0.2 %  Urinalysis, Complete w Microscopic Urine, Clean Catch     Status: Abnormal   Collection Time: 02/06/21  1:30 AM  Result Value Ref Range   Color, Urine AMBER (A) YELLOW   APPearance CLOUDY (A) CLEAR   Specific Gravity, Urine 1.025 1.005 - 1.030   pH 5.0 5.0 - 8.0   Glucose, UA NEGATIVE NEGATIVE mg/dL   Hgb urine dipstick LARGE (A) NEGATIVE   Bilirubin Urine NEGATIVE NEGATIVE   Ketones, ur 5 (A) NEGATIVE mg/dL   Protein, ur 629 (A) NEGATIVE mg/dL   Nitrite NEGATIVE NEGATIVE   Leukocytes,Ua NEGATIVE NEGATIVE   RBC / HPF >50 (H) 0 - 5 RBC/hpf   WBC, UA NONE SEEN 0 - 5 WBC/hpf   Bacteria, UA NONE SEEN NONE SEEN   Squamous Epithelial / LPF  NONE SEEN 0 - 5   Mucus PRESENT   ABO/Rh     Status: None   Collection Time: 02/06/21  1:34 AM  Result Value Ref Range   ABO/RH(D)      O POS Performed at [facility], [address] [place], [place] [address]   POC urine preg, ED     Status: None   Collection Time: 02/06/21  1:45 AM  Result Value Ref Range   Preg Test, Ur NEGATIVE NEGATIVE  Wet prep, genital     Status: Abnormal   Collection Time: 02/06/21  4:03 AM  Result Value Ref Range   Yeast Wet Prep HPF POC NONE SEEN NONE SEEN   Trich, Wet Prep NONE SEEN NONE SEEN   Clue Cells Wet Prep HPF POC NONE SEEN NONE SEEN   WBC, Wet Prep HPF POC FEW (A) NONE SEEN   Sperm NONE SEEN   Chlamydia/NGC rt PCR ([facility] only)     Status: None   Collection Time: 02/06/21  4:03 AM  Result Value Ref Range   Specimen source GC/Chlam endoc    Chlamydia Tr NOT DETECTED NOT DETECTED   N gonorrhoeae NOT DETECTED NOT DETECTED  Prepare RBC (crossmatch)     Status: None   Collection Time: 02/06/21  4:18 AM  Result Value Ref Range   Order Confirmation      ORDER PROCESSED BY BLOOD BANK Performed at [facility], [address] [place], [place]  [id number]   Protime-INR     Status: None   Collection Time: 02/06/21  4:18 AM  Result Value Ref Range   Prothrombin Time 14.2 11.4 - 15.2 seconds   INR 1.1 0.8 - 1.2  Resp Panel by RT-PCR (Flu A&B, Covid) Nasopharyngeal Swab     Status: None   Collection Time: 02/06/21  4:18 AM   Specimen: Nasopharyngeal Swab; Nasopharyngeal(NP) swabs in vial transport medium  Result Value Ref Range   SARS Coronavirus 2 by RT PCR NEGATIVE NEGATIVE   Influenza A by PCR NEGATIVE NEGATIVE   Influenza B by PCR NEGATIVE NEGATIVE  Prepare RBC (crossmatch)     Status: None   Collection Time: 02/06/21  5:28 AM  Result Value Ref Range   Order Confirmation      DUPLICATE REQUEST Performed at [facility], [address] [place], [place] [address]   Type and screen Ordered by PROVIDER DEFAULT     Status: None (Preliminary result)   Collection Time: 02/06/21  6:13 AM  Result Value Ref Range   ABO/RH(D) O POS    Antibody Screen NEG    Sample Expiration 02/09/2021,2359    Unit Number [id number]    Blood Component Type RED CELLS,LR    Unit division 00    Status of Unit ISSUED    Transfusion Status OK TO TRANSFUSE    Crossmatch Result Compatible    Unit Number [id number]    Blood Component Type RED CELLS,LR    Unit division 00    Status of Unit ALLOCATED    Transfusion Status OK TO TRANSFUSE    Crossmatch Result      Compatible Performed at [facility], [address] [place], [place] [address]     Cultures: Results for orders placed or performed during the hospital encounter of 02/06/21  Wet prep, genital     Status: Abnormal   Collection Time: 02/06/21  4:03 AM  Result Value Ref Range Status   Yeast Wet Prep HPF POC NONE  SEEN NONE SEEN Final   Trich, Wet Prep NONE SEEN NONE SEEN Final   Clue Cells Wet Prep HPF POC NONE SEEN NONE SEEN Final   WBC, Wet Prep HPF POC FEW (A) NONE SEEN Final   Sperm NONE SEEN  Final    Comment: Performed at [facility], [address] [place], [place] [address]  Chlamydia/NGC rt PCR [facility] only)     Status: None   Collection Time: 02/06/21  4:03 AM  Result Value Ref Range Status   Specimen source GC/Chlam endoc  Final   Chlamydia Tr NOT DETECTED NOT DETECTED Final   N gonorrhoeae NOT DETECTED NOT DETECTED Final    Comment: (NOTE) This CT/NG assay has not been evaluated in patients with a history of  hysterectomy. Performed at [facility], [address] [place], [place] [address]   Resp Panel by RT-PCR (Flu A&B, Covid) Nasopharyngeal Swab     Status: None   Collection Time: 02/06/21  4:18 AM   Specimen: Nasopharyngeal Swab; Nasopharyngeal(NP) swabs in vial transport medium  Result Value Ref Range Status   SARS Coronavirus 2 by RT PCR NEGATIVE NEGATIVE Final    Comment: (NOTE) SARS-CoV-2 target nucleic acids are NOT DETECTED.  The SARS-CoV-2 RNA is generally detectable in upper respiratory specimens during the acute phase of infection. The lowest concentration of SARS-CoV-2 viral copies this assay can detect is 138 copies/mL. A negative result does not preclude SARS-Cov-2 infection and should not be used as the sole basis for treatment or other patient management decisions. A negative result may occur with  improper specimen collection/handling, submission of specimen other than nasopharyngeal swab, presence of viral mutation(s) within the areas targeted by this assay, and inadequate number of viral copies(<138 copies/mL). A negative result must be combined with clinical observations, patient history, and epidemiological information. The expected result is Negative.  Fact Sheet for Patients:  BloggerCourse.com  Fact Sheet for Healthcare Providers:  SeriousBroker.it  This test is no t yet approved or cleared by the [place] FDA and  has been authorized for detection and/or diagnosis of SARS-CoV-2 by FDA under an Emergency Use  Authorization (EUA). This EUA will remain  in effect (meaning this test can be used) for the duration of the COVID-19 declaration under Section 564(b)(1) of the Act, 21 U.S.C.section 360bbb-3(b)(1), unless the authorization is terminated  or revoked sooner.       Influenza A by PCR NEGATIVE NEGATIVE Final   Influenza B by PCR NEGATIVE NEGATIVE Final    Comment: (NOTE) The Xpert Xpress SARS-CoV-2/FLU/RSV plus assay is intended as an aid in the diagnosis of influenza from Nasopharyngeal swab specimens and should not be used as a sole basis for treatment. Nasal washings and aspirates are unacceptable for Xpert Xpress SARS-CoV-2/FLU/RSV testing.  Fact Sheet for Patients: BloggerCourse.com  Fact Sheet for Healthcare Providers: SeriousBroker.it  This test is not yet approved or cleared by the [place] FDA and has been authorized for detection and/or diagnosis of SARS-CoV-2 by FDA under an Emergency Use Authorization (EUA). This EUA will remain in effect (meaning this test can be used) for the duration of the COVID-19 declaration under Section 564(b)(1) of the Act, 21 U.S.C. section 360bbb-3(b)(1), unless the authorization is terminated or revoked.  Performed at [facility], [address] [place], [place] [address]     Imaging: US PELVIS (TRANSABDOMINAL ONLY)  Result Date: 02/06/2021 CLINICAL DATA:  Left lower quadrant pain for 4 days EXAM: TRANSABDOMINAL ULTRASOUND OF PELVIS  DOPPLER ULTRASOUND OF OVARIES TECHNIQUE: Transabdominal ultrasound examination of the pelvis was performed including evaluation of the uterus, ovaries, adnexal regions, and pelvic cul-de-sac. Color and duplex Doppler ultrasound was utilized to evaluate blood flow to the ovaries. COMPARISON:  Pelvic ultrasound 12/05/2019 FINDINGS: Uterus Measurements: 7.8 x 3.7 x 4.9 cm = volume: 73.5 mL. Anteverted uterus. No concerning uterine masses or  discernible fibroids are identified. A fundal fibroid seen on comparison ultrasound is less well visualized on transabdominal only technique. Endometrium Thickness: 3.8 mm, non thickened.  No focal abnormality visualized. Right ovary Measurements: 3.7 x 1.8 x 2.4 cm = volume: 8.3 mL. Normal appearance/no adnexal mass. Left ovary Measurements: 4.9 x 2.1 x 2.0 cm = volume: 14.4 mL. Normal appearance/no adnexal mass. Pulsed Doppler evaluation demonstrates normal low-resistance arterial and venous waveforms in both ovaries. Other: Patient declined transvaginal scanning technique. IMPRESSION: 1. Unremarkable pelvic ultrasound. No evidence of ovarian torsion or other acute pelvic abnormality. 2. Previously seen fundal fibroid less well visualized on today's transabdominal technique. 3. Patient declined transvaginal scanning technique. Electronically Signed   By: Kreg Shropshire M.D.   On: 02/06/2021 03:14   US PELVIC DOPPLER (TORSION R/O OR MASS ARTERIAL FLOW)  Result Date: 02/06/2021 CLINICAL DATA:  Left lower quadrant pain for 4 days EXAM: TRANSABDOMINAL ULTRASOUND OF PELVIS DOPPLER ULTRASOUND OF OVARIES TECHNIQUE: Transabdominal ultrasound examination of the pelvis was performed including evaluation of the uterus, ovaries, adnexal regions, and pelvic cul-de-sac. Color and duplex Doppler ultrasound was utilized to evaluate blood flow to the ovaries. COMPARISON:  Pelvic ultrasound 12/05/2019 FINDINGS: Uterus Measurements: 7.8 x 3.7 x 4.9 cm = volume: 73.5 mL. Anteverted uterus. No concerning uterine masses or discernible fibroids are identified. A fundal fibroid seen on comparison ultrasound is less well visualized on transabdominal only technique. Endometrium Thickness: 3.8 mm, non thickened.  No focal abnormality visualized. Right ovary Measurements: 3.7 x 1.8 x 2.4 cm = volume: 8.3 mL. Normal appearance/no adnexal mass. Left ovary Measurements: 4.9 x 2.1 x 2.0 cm = volume: 14.4 mL. Normal appearance/no adnexal mass.  Pulsed Doppler evaluation demonstrates normal low-resistance arterial and venous waveforms in both ovaries. Other: Patient declined transvaginal scanning technique. IMPRESSION: 1. Unremarkable pelvic ultrasound. No evidence of ovarian torsion or other acute pelvic abnormality. 2. Previously seen fundal fibroid less well visualized on today's transabdominal technique. 3. Patient declined transvaginal scanning technique. Electronically Signed   By: Kreg Shropshire M.D.   On: 02/06/2021 03:14     Assessment   35 y.o. [id number] Hospital Day: 1 with menorrhagia and acute blood loss anemia.   Plan   1. Vaginal bleeding has improved since initiating PO estrogen.   2. 1st unit of pRBC's is currently infusing, 2nd unit to follow.  3. Plan for H&H after 2nd unit is completed.   4. Will consider discharge home today as long as clinically stable, vaginal bleeding continues to improve, and post-transfusion H&H shows improvement.  5. Discussed plan of care with Dr. Dalbert Garnet.   6. On discharge will prescribe OCP taper with follow up outpatient with Dr. Dalbert Garnet.   Margaretmary Eddy, CNM Certified Nurse Midwife [facility]

## 2021-02-06 NOTE — Progress Notes (Signed)
Patient discharged.  Discharge instructions given.  Patient verbalizes understanding.  Will be transported by auxiliary when transportation arrives.

## 2021-02-06 NOTE — ED Notes (Signed)
Report given to Arna Medici, rn

## 2021-02-06 NOTE — Discharge Summary (Signed)
GynecologicalDischarge Summary  Patient Name: Karina Ray DOB: 1986/01/13 MRN: [id number]  Date of Admission: 02/06/2021 Date of Discharge: 02/06/2021  Hospital course:   Karina Ray was admitted for management of menorrhagia and acute blood loss anemia.  She received 2 units of pRBC and PO estrogen.  Her vaginal bleeding improved with PO estrogen.  H&H improved from 6.7/24.6 to 9.9/33.  She was able to ambulate and perform ADL's without symptoms of fatigue, vertigo, or SOB. Prescription was given for OCP taper.  Instructed to take 1 pill 3 x daily for 3 days, 2 pills 2x daily for 2 days, and then 1 pill daily for the remainder of the pack.   She was deemed stable for discharge.  She was given specific instructions and numbers to call in written and verbal format. She verbalized understanding, agrees with the plan of care, and all questions answered to her satisfaction.  Discharge Physical Exam:  BP 108/63 (BP Location: Right Arm)   Pulse 87   Temp 98.8 F (37.1 C) (Oral)   Resp 18   Ht 5\' 3"  (1.6 m)   Wt 69.4 kg   LMP 02/06/2021 (Exact Date)   SpO2 100%   BMI 27.10 kg/m   General: NAD CV: RRR Pulm: CTABL, nl effort ABD: s/nd/nt Pelvic: minimal vaginal bleeding  DVT Evaluation: LE non-ttp, no evidence of DVT on exam.  Hemoglobin  Date Value Ref Range Status  02/06/2021 9.9 (L) 12.0 - 15.0 g/dL Final    Comment:    REPEATED TO VERIFY   HGB  Date Value Ref Range Status  09/12/2014 12.4 12.0 - 16.0 g/dL Final   HCT  Date Value Ref Range Status  02/06/2021 33.0 (L) 36.0 - 46.0 % Final    Comment:    Performed at [facility], [address] [place]  09/12/2014 38.1 35.0 - 47.0 % Final      Plan:  Karina Ray was discharged to home in good condition. Follow-up appointment at [facility] in 2-4 weeks   Discharge Medications: Allergies as of 02/06/2021   No Known Allergies     Medication List    TAKE these medications    norgestimate-ethinyl estradiol 0.25-35 MG-MCG tablet Commonly known as: Sprintec 28 Take 1 tablet by mouth 3 times a day for 3 days.  Then take 1 tablet by mouth 2 times a day for 2 days.  Then take 1 tablet by mouth daily for the remainder of the pack.   ondansetron 4 MG tablet Commonly known as: Zofran Take 1 tablet (4 mg total) by mouth every 8 (eight) hours as needed for nausea or vomiting.        Follow-up Information    Schedule an appointment as soon as possible for a visit with 07-10-1999, MD.   Specialty: Obstetrics and Gynecology Why: in 2-4 weeks OR with your OB/GYN if preferred  Contact information: [address] [place] [phone]               Signed: [phone], CNM 5:39 PM  Gustavo Lah, CNM Certified Nurse Midwife [facility]  [facility]

## 2021-02-06 NOTE — Consult Note (Signed)
Consult History and Physical   SERVICE: Gynecology   Patient Name: Karina Ray Patient MRN:   [id number]  CC: Heavy vaginal bleeding, dysmenorrhea  HPI: Karina Ray is a 35 y.o. [id number]. with hx of menorrhagia, now presenting with significant anemia with hgb of 6.7 and ongoing vaginal bleeding. This is the normal time of her menstrual cycle, and cycles have become more regular and last longer.  She is on no hormones for management. No known hx of coaguopathy. Not currently pregnant.   Prior TVUS found fundal fibroid, not appreciated on transabdominal ultrasound today, otherwise normal imaging   Review of Systems: positives in bold Review of Systems  Constitutional: Negative for chills and fever.  Eyes: Negative for blurred vision.  Respiratory: Negative for cough and shortness of breath.   Cardiovascular: Negative for chest pain and palpitations.  Gastrointestinal: Negative for nausea and vomiting.  Genitourinary: Negative for dysuria.       + vaginal bleeding  Musculoskeletal: Negative for myalgias.  Neurological: Negative for dizziness and headaches.  Endo/Heme/Allergies: Does not bruise/bleed easily.  Psychiatric/Behavioral: The patient is not nervous/anxious.    Past Obstetrical History: OB History    Gravida  4   Para  2   Term  2   Preterm      AB  2   Living  2     SAB  1   IAB  1   Ectopic      Multiple      Live Births  2           Past Gynecologic History: Patient's last menstrual period was 02/06/2021 (exact date). Menstrual frequency every 28-30 days lasting 5-7 days.  Reports heavy flow that has been getting worse over the past couple of months.   Past Medical History: Past Medical History:  Diagnosis Date  . Abnormal Pap smear of cervix     Past Surgical History:   Past Surgical History:  Procedure Laterality Date  . CERVICAL CERCLAGE    . COLPOSCOPY  02/2016; 10/2018, 03/2020  . LEEP  05/05/2020    Family History:   Family history reviewed, non-contributory   Social History:  reports that she has never smoked. She has never used smokeless tobacco. She reports that she does not drink alcohol and does not use drugs.   Home Medications:  Medications reconciled in EPIC  No current facility-administered medications on file prior to encounter.   No current outpatient medications on file prior to encounter.    Allergies:  No Known Allergies  Physical Exam:  Temp:  [97.8 F (36.6 C)-98.4 F (36.9 C)] 98.2 F (36.8 C) (04/16 0852) Pulse Rate:  [66-95] 82 (04/16 0852) Resp:  [14-18] 18 (04/16 0852) BP: (100-129)/(68-87) 129/87 (04/16 0852) SpO2:  [99 %-100 %] 100 % (04/16 0852) Weight:  [69.4 kg] 69.4 kg (04/16 0129)  Physical Exam Constitutional:      Appearance: She is well-developed.  HENT:     Head: Normocephalic.  Cardiovascular:     Rate and Rhythm: Normal rate and regular rhythm.     Heart sounds: Normal heart sounds.  Pulmonary:     Effort: Pulmonary effort is normal.     Breath sounds: Normal breath sounds.  Abdominal:     General: Abdomen is flat. Bowel sounds are normal. There is no distension.     Palpations: Abdomen is soft.     Tenderness: There is no abdominal tenderness.  Genitourinary:    Vagina: Bleeding (small amount of dark red  blood present) present.     Uterus: Normal. Not tender.      Adnexa: Right adnexa normal.       Right: No tenderness.         Left: Tenderness (mild tenderness to palpation) present.   Skin:    General: Skin is warm and dry.     Capillary Refill: Capillary refill takes less than 2 seconds.     Coloration: Skin is pale.  Neurological:     General: No focal deficit present.     Mental Status: She is alert and oriented to person, place, and time.  Psychiatric:        Mood and Affect: Mood normal.       Labs/Studies:   CBC and Coags:  Lab Results  Component Value Date   WBC 7.1 02/06/2021   NEUTOPHILPCT 56 08/04/2016   EOSPCT 6  08/04/2016   BASOPCT 1 08/04/2016   LYMPHOPCT 28 08/04/2016   HGB 6.7 (L) 02/06/2021   HCT 24.6 (L) 02/06/2021   MCV 68.0 (L) 02/06/2021   PLT 389 02/06/2021   INR 1.1 02/06/2021   CMP:  Lab Results  Component Value Date   NA 139 02/06/2021   K 4.0 02/06/2021   CL 109 02/06/2021   CO2 24 02/06/2021   BUN 10 02/06/2021   CREATININE 0.64 02/06/2021   CREATININE 0.61 12/05/2019   CREATININE 0.67 08/04/2016   PROT 7.4 02/06/2021   BILITOT 0.3 02/06/2021   ALT 16 02/06/2021   AST 19 02/06/2021   ALKPHOS 78 02/06/2021    Other Imaging: US PELVIS (TRANSABDOMINAL ONLY)  Result Date: 02/06/2021 CLINICAL DATA:  Left lower quadrant pain for 4 days EXAM: TRANSABDOMINAL ULTRASOUND OF PELVIS DOPPLER ULTRASOUND OF OVARIES TECHNIQUE: Transabdominal ultrasound examination of the pelvis was performed including evaluation of the uterus, ovaries, adnexal regions, and pelvic cul-de-sac. Color and duplex Doppler ultrasound was utilized to evaluate blood flow to the ovaries. COMPARISON:  Pelvic ultrasound 12/05/2019 FINDINGS: Uterus Measurements: 7.8 x 3.7 x 4.9 cm = volume: 73.5 mL. Anteverted uterus. No concerning uterine masses or discernible fibroids are identified. A fundal fibroid seen on comparison ultrasound is less well visualized on transabdominal only technique. Endometrium Thickness: 3.8 mm, non thickened.  No focal abnormality visualized. Right ovary Measurements: 3.7 x 1.8 x 2.4 cm = volume: 8.3 mL. Normal appearance/no adnexal mass. Left ovary Measurements: 4.9 x 2.1 x 2.0 cm = volume: 14.4 mL. Normal appearance/no adnexal mass. Pulsed Doppler evaluation demonstrates normal low-resistance arterial and venous waveforms in both ovaries. Other: Patient declined transvaginal scanning technique. IMPRESSION: 1. Unremarkable pelvic ultrasound. No evidence of ovarian torsion or other acute pelvic abnormality. 2. Previously seen fundal fibroid less well visualized on today's transabdominal technique. 3.  Patient declined transvaginal scanning technique. Electronically Signed   By: Kreg Shropshire M.D.   On: 02/06/2021 03:14   US PELVIC DOPPLER (TORSION R/O OR MASS ARTERIAL FLOW)  Result Date: 02/06/2021 CLINICAL DATA:  Left lower quadrant pain for 4 days EXAM: TRANSABDOMINAL ULTRASOUND OF PELVIS DOPPLER ULTRASOUND OF OVARIES TECHNIQUE: Transabdominal ultrasound examination of the pelvis was performed including evaluation of the uterus, ovaries, adnexal regions, and pelvic cul-de-sac. Color and duplex Doppler ultrasound was utilized to evaluate blood flow to the ovaries. COMPARISON:  Pelvic ultrasound 12/05/2019 FINDINGS: Uterus Measurements: 7.8 x 3.7 x 4.9 cm = volume: 73.5 mL. Anteverted uterus. No concerning uterine masses or discernible fibroids are identified. A fundal fibroid seen on comparison ultrasound is less well visualized on transabdominal only technique.  Endometrium Thickness: 3.8 mm, non thickened.  No focal abnormality visualized. Right ovary Measurements: 3.7 x 1.8 x 2.4 cm = volume: 8.3 mL. Normal appearance/no adnexal mass. Left ovary Measurements: 4.9 x 2.1 x 2.0 cm = volume: 14.4 mL. Normal appearance/no adnexal mass. Pulsed Doppler evaluation demonstrates normal low-resistance arterial and venous waveforms in both ovaries. Other: Patient declined transvaginal scanning technique. IMPRESSION: 1. Unremarkable pelvic ultrasound. No evidence of ovarian torsion or other acute pelvic abnormality. 2. Previously seen fundal fibroid less well visualized on today's transabdominal technique. 3. Patient declined transvaginal scanning technique. Electronically Signed   By: Kreg Shropshire M.D.   On: 02/06/2021 03:14     Assessment / Plan:   SHABREKA COULON is a 35 y.o. [id number] who presents with menorrhagia and severe blood loss anemia with ongoing bleeding  1. Admission for 2 units pRBCs, po conjugated equine estrogen (2.5mg  po q6 hrs for 24 hrs) with phenergine suppositories. 2. Covid test negative   3. Will recheck H&H after 2nd unit of pRBC is infused.  4. Plan to discharge when stable with Rx for OCP taper for outpatient management of menorrhagia.   5.  Karina Ray will follow up with Dr. Dalbert Garnet outpatient for further management.   Thank you for the opportunity to be involved with this pt's care.   Margaretmary Eddy CNM in consultation with Dr. Christeen Douglas   Dr. Christeen Douglas  OB/GYN [facility]

## 2021-02-07 LAB — TYPE AND SCREEN
ABO/RH(D): O POS
Antibody Screen: NEGATIVE
Unit division: 0
Unit division: 0

## 2021-02-07 LAB — BPAM RBC
Blood Product Expiration Date: 202204232359
Blood Product Expiration Date: 202205092359
ISSUE DATE / TIME: 202204160823
ISSUE DATE / TIME: 202204161121
Unit Type and Rh: 5100
Unit Type and Rh: 5100

## 2021-10-29 IMAGING — US US PELVIS COMPLETE WITH TRANSVAGINAL
1 series · 13 of 25 positions shown · non-contrast
Comparison: None

CLINICAL DATA: Lower abdominal pain for 2 weeks.

EXAM:
TRANSABDOMINAL AND TRANSVAGINAL ULTRASOUND OF PELVIS
TECHNIQUE: Both transabdominal and transvaginal ultrasound examinations of the
pelvis were performed. Transabdominal technique was performed for
global imaging of the pelvis including uterus, ovaries, adnexal
regions, and pelvic cul-de-sac. It was necessary to proceed with
endovaginal exam following the transabdominal exam to visualize the
uterus, endometrium and ovaries to better advantage.

[Series 1: us pelvis complete with transvaginal · 13 of 84 slices shown]
[im 1/84]
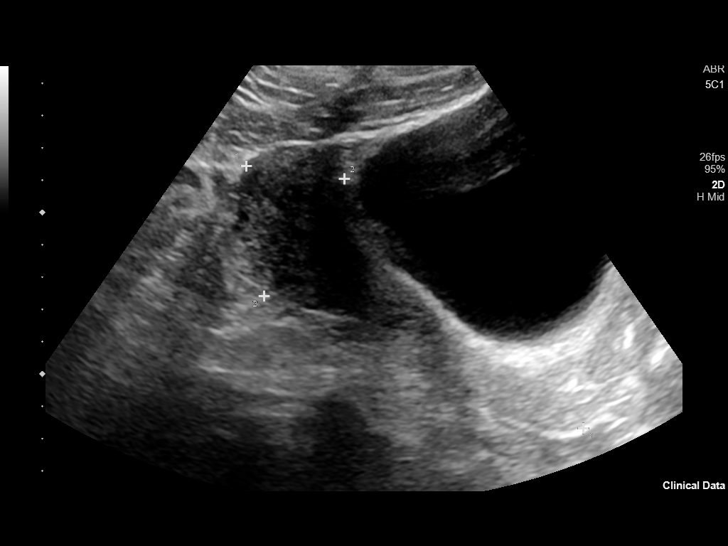
[im 7/84]
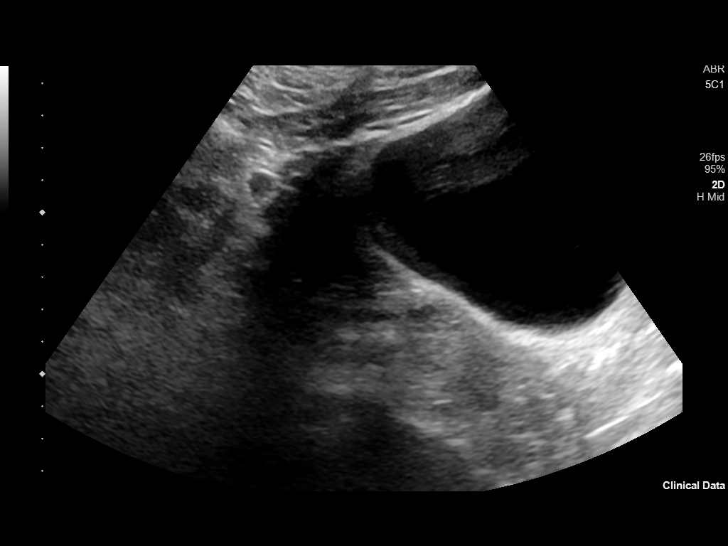
[im 14/84]
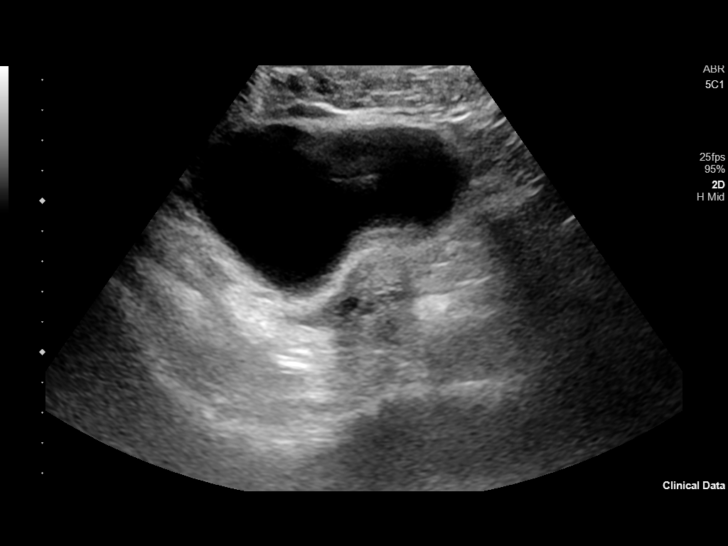
[im 21/84]
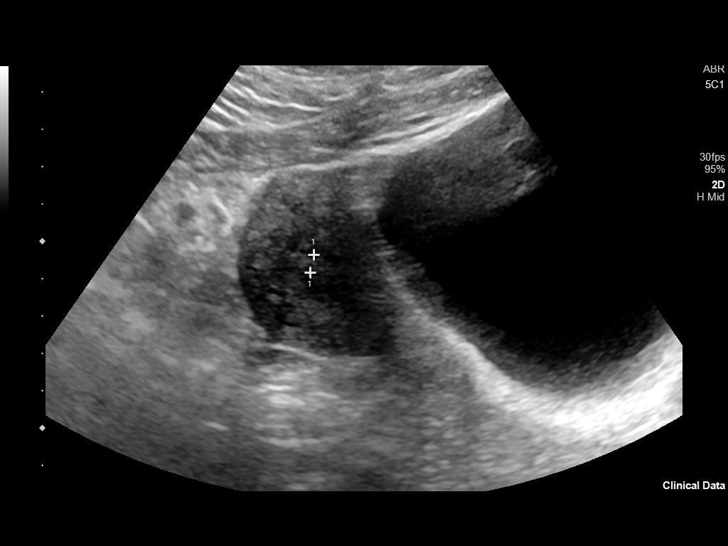
[im 28/84]
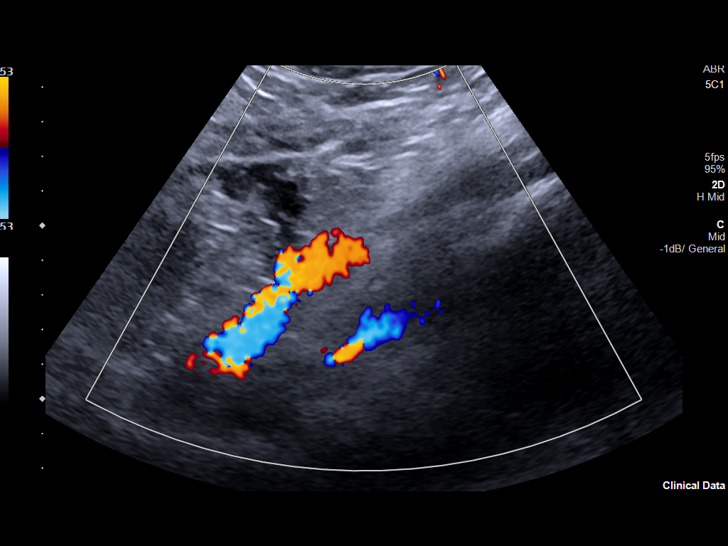
[im 35/84]
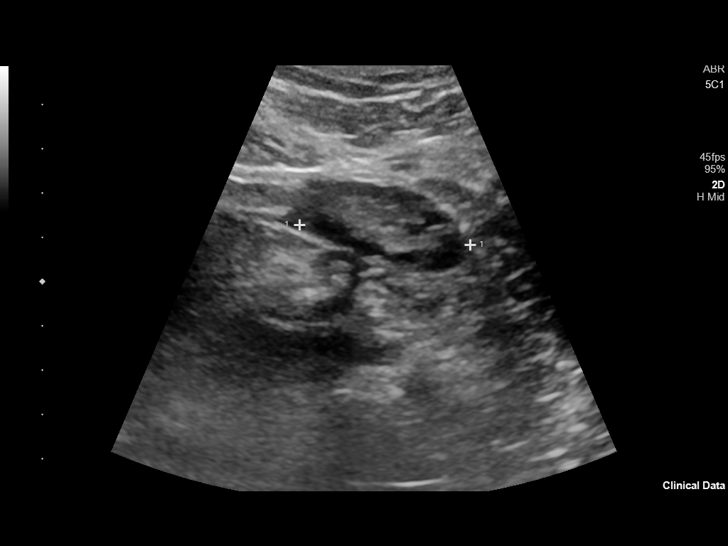
[im 42/84]
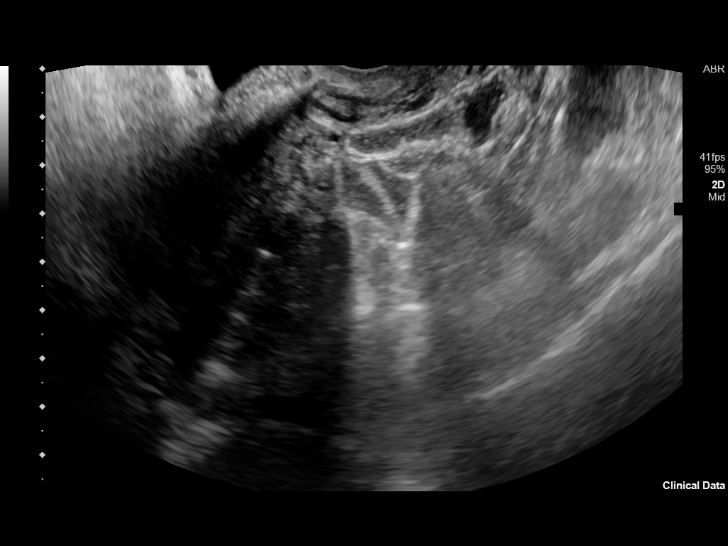
[im 49/84]
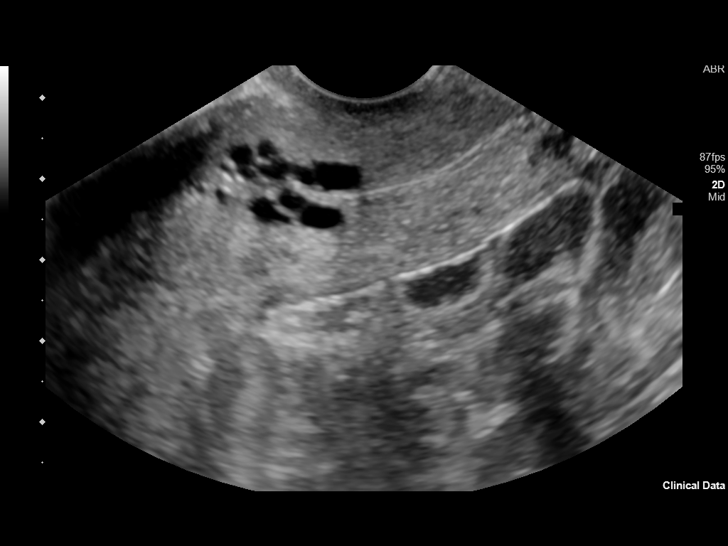
[im 56/84]
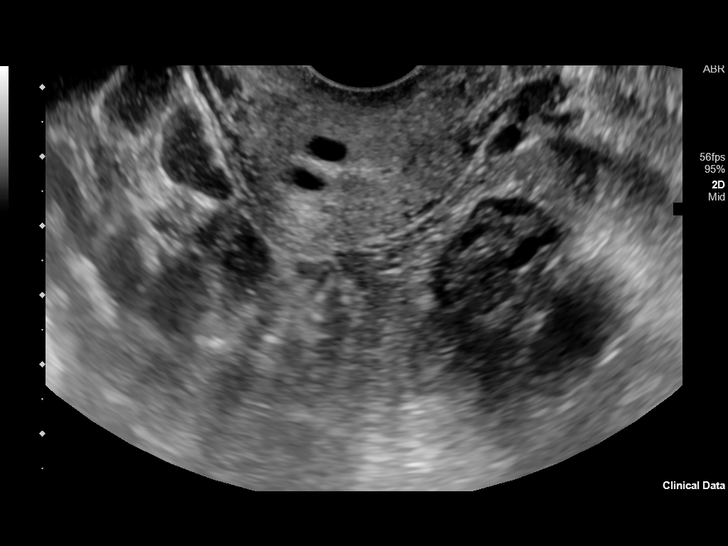
[im 63/84]
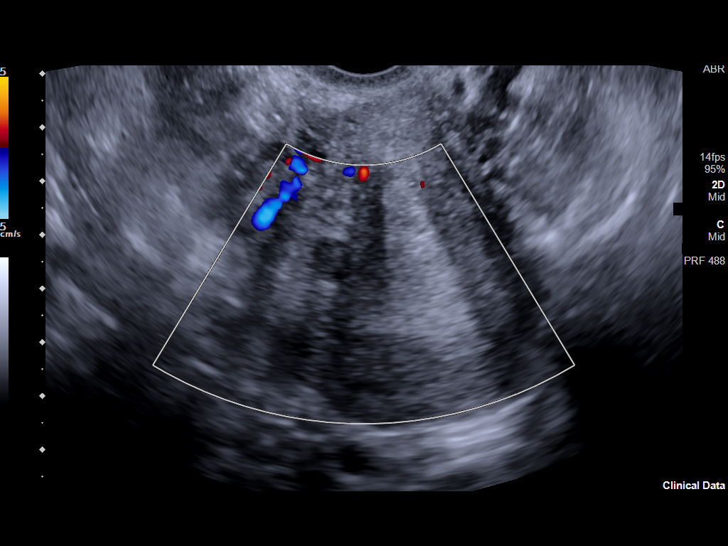
[im 70/84]
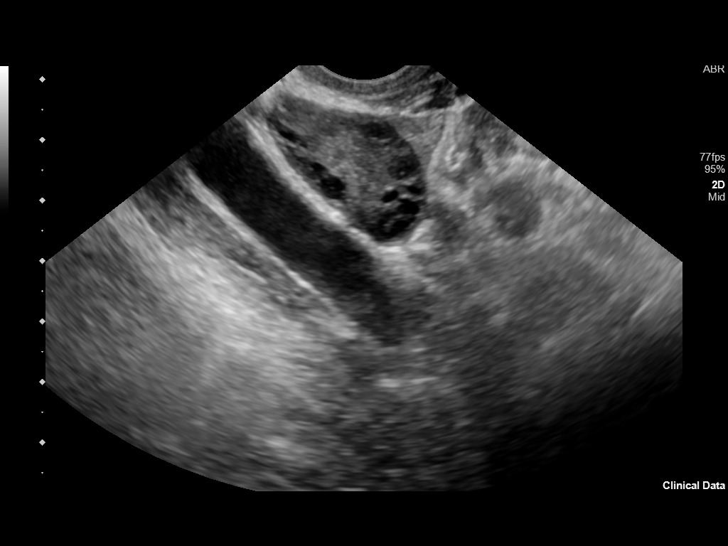
[im 77/84]
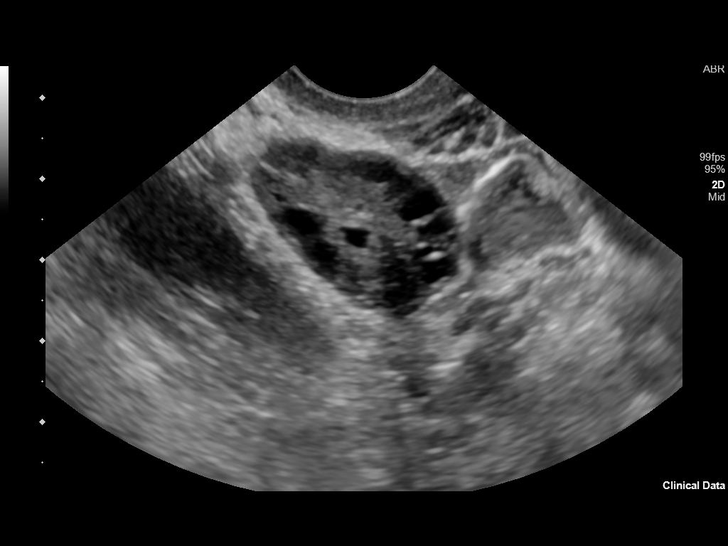
[im 84/84]
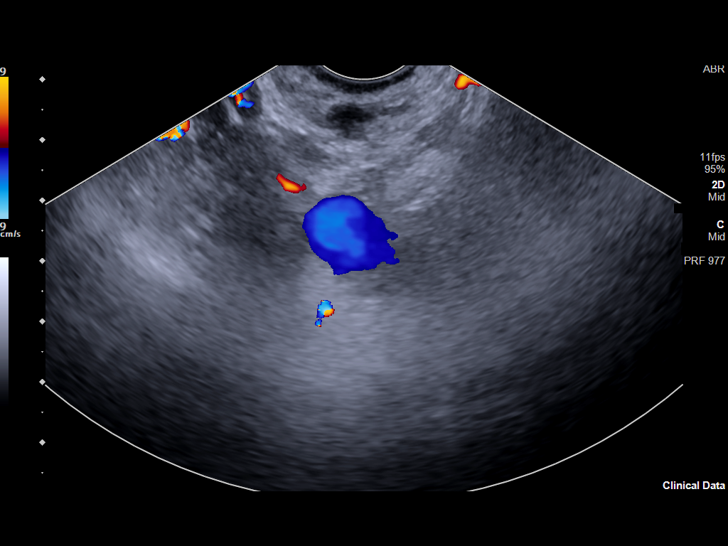

[13 of 25 positions shown; findings below may reference images not displayed]

FINDINGS: Uterus

Measurements: 9.7 x 4.5 x 4.8 cm = volume: 109.6 mL. Small mural
fibroid, adjacent to the endometrium, right upper uterine segment,
1.0 x 0.9 x 0.8 cm. No other uterine masses or abnormalities. Cervix
unremarkable.

Endometrium

Thickness: 13 mm.  No focal abnormality visualized.

Right ovary

Measurements: 2.7 x 1.9 x 2.1 cm = volume: 5.6 mL. Normal
appearance/no adnexal mass.

Left ovary

Measurements: 3.7 x 2.1 x 3.9 cm = volume: 15.9 mL. Normal
appearance/no adnexal mass.

Other findings

No abnormal free fluid.
IMPRESSION: 1. No acute findings. No findings to account for lower abdominal
pain.
2. 1 cm uterine fibroid.  No other abnormalities.

## 2022-05-10 ENCOUNTER — Encounter: Payer: Self-pay | Admitting: Emergency Medicine

## 2022-05-10 DIAGNOSIS — N920 Excessive and frequent menstruation with regular cycle: Secondary | ICD-10-CM | POA: Diagnosis not present

## 2022-05-10 DIAGNOSIS — D649 Anemia, unspecified: Secondary | ICD-10-CM | POA: Diagnosis not present

## 2022-05-10 DIAGNOSIS — N939 Abnormal uterine and vaginal bleeding, unspecified: Secondary | ICD-10-CM | POA: Diagnosis present

## 2022-05-10 LAB — CBC WITH DIFFERENTIAL/PLATELET
Abs Immature Granulocytes: 0.03 10*3/uL (ref 0.00–0.07)
Basophils Absolute: 0 10*3/uL (ref 0.0–0.1)
Basophils Relative: 0 %
Eosinophils Absolute: 0.2 10*3/uL (ref 0.0–0.5)
Eosinophils Relative: 3 %
HCT: 22.9 % — ABNORMAL LOW (ref 36.0–46.0)
Hemoglobin: 6.8 g/dL — ABNORMAL LOW (ref 12.0–15.0)
Immature Granulocytes: 0 %
Lymphocytes Relative: 37 %
Lymphs Abs: 3.3 10*3/uL (ref 0.7–4.0)
MCH: 24.1 pg — ABNORMAL LOW (ref 26.0–34.0)
MCHC: 29.7 g/dL — ABNORMAL LOW (ref 30.0–36.0)
MCV: 81.2 fL (ref 80.0–100.0)
Monocytes Absolute: 0.6 10*3/uL (ref 0.1–1.0)
Monocytes Relative: 7 %
Neutro Abs: 4.7 10*3/uL (ref 1.7–7.7)
Neutrophils Relative %: 53 %
Platelets: 322 10*3/uL (ref 150–400)
RBC: 2.82 MIL/uL — ABNORMAL LOW (ref 3.87–5.11)
RDW: 16.1 % — ABNORMAL HIGH (ref 11.5–15.5)
WBC: 8.8 10*3/uL (ref 4.0–10.5)
nRBC: 0.3 % — ABNORMAL HIGH (ref 0.0–0.2)

## 2022-05-10 LAB — COMPREHENSIVE METABOLIC PANEL
ALT: 16 U/L (ref 0–44)
AST: 19 U/L (ref 15–41)
Albumin: 3.6 g/dL (ref 3.5–5.0)
Alkaline Phosphatase: 82 U/L (ref 38–126)
Anion gap: 5 (ref 5–15)
BUN: 9 mg/dL (ref 6–20)
CO2: 25 mmol/L (ref 22–32)
Calcium: 8.6 mg/dL — ABNORMAL LOW (ref 8.9–10.3)
Chloride: 110 mmol/L (ref 98–111)
Creatinine, Ser: 0.73 mg/dL (ref 0.44–1.00)
GFR, Estimated: >60 mL/min (ref >60)
Glucose, Bld: 140 mg/dL — ABNORMAL HIGH (ref 70–99)
Potassium: 3.6 mmol/L (ref 3.5–5.1)
Sodium: 140 mmol/L (ref 135–145)
Total Bilirubin: 0.2 mg/dL — ABNORMAL LOW (ref 0.3–1.2)
Total Protein: 7.2 g/dL (ref 6.5–8.1)

## 2022-05-10 LAB — URINALYSIS, ROUTINE W REFLEX MICROSCOPIC
Bacteria, UA: NONE SEEN
Bilirubin Urine: NEGATIVE
Glucose, UA: NEGATIVE mg/dL
Ketones, ur: 5 mg/dL — AB
Leukocytes,Ua: NEGATIVE
Nitrite: NEGATIVE
Protein, ur: 30 mg/dL — AB
RBC / HPF: >50 RBC/hpf — ABNORMAL HIGH (ref 0–5)
Specific Gravity, Urine: 1.023 (ref 1.005–1.030)
pH: 5 (ref 5.0–8.0)

## 2022-05-10 LAB — POC URINE PREG, ED: Preg Test, Ur: NEGATIVE

## 2022-05-10 NOTE — ED Triage Notes (Signed)
Pt presents via POV with complaints of vaginal bleeding for the last 3-4 weeks. Pt stated that she has an appointment with OBGYN coming up next week but she has been feeling more tired and wanted to be seen sooner. Denies CP, SOB, abdominal pain, dizziness, or N/V.

## 2022-05-11 ENCOUNTER — Emergency Department
Admission: EM | Admit: 2022-05-11 | Discharge: 2022-05-11 | Disposition: A | Discharge status: Home / Self Care | Payer: BC Managed Care – PPO | Attending: Emergency Medicine | Admitting: Emergency Medicine

## 2022-05-11 ENCOUNTER — Other Ambulatory Visit: Payer: Self-pay

## 2022-05-11 ENCOUNTER — Emergency Department: Payer: BC Managed Care – PPO

## 2022-05-11 DIAGNOSIS — D649 Anemia, unspecified: Secondary | ICD-10-CM

## 2022-05-11 DIAGNOSIS — N920 Excessive and frequent menstruation with regular cycle: Secondary | ICD-10-CM

## 2022-05-11 LAB — URINALYSIS, COMPLETE (UACMP) WITH MICROSCOPIC
Bacteria, UA: NONE SEEN
Bilirubin Urine: NEGATIVE
Glucose, UA: NEGATIVE mg/dL
Ketones, ur: NEGATIVE mg/dL
Leukocytes,Ua: NEGATIVE
Nitrite: NEGATIVE
Protein, ur: NEGATIVE mg/dL
Specific Gravity, Urine: 1.008 (ref 1.005–1.030)
pH: 7 (ref 5.0–8.0)

## 2022-05-11 LAB — PREPARE RBC (CROSSMATCH)

## 2022-05-11 LAB — TRANSFUSION REACTION
DAT C3: NEGATIVE
Post RXN DAT IgG: NEGATIVE

## 2022-05-11 MED ORDER — DIPHENHYDRAMINE HCL 50 MG/ML IJ SOLN
25.0000 mg | Freq: Once | INTRAMUSCULAR | Status: AC
  Administered 2022-05-11: 25 mg via INTRAVENOUS
  Filled 2022-05-11: qty 1

## 2022-05-11 MED ORDER — NORGESTIMATE-ETH ESTRADIOL 0.25-35 MG-MCG PO TABS: ORAL_TABLET | ORAL | 1 refills | Status: AC

## 2022-05-11 MED ORDER — SODIUM CHLORIDE 0.9 % IV SOLN: 10.0000 mL/h | Freq: Once | INTRAVENOUS | Status: DC

## 2022-05-11 NOTE — ED Provider Notes (Addendum)
[facility] Provider Note    Event Date/Time   First MD Initiated Contact with Patient 05/11/22 0201     (approximate)   History   Vaginal Bleeding   HPI  Karina Ray is a 36 y.o. female who presents to the ED from home with a chief complaint of vaginal bleeding.  Patient with a history of menorrhagia requiring blood transfusion and hospitalization last year.  Reports vaginal bleeding for the past 4 to 6 weeks, sometimes heavy with clots.  Feeling more fatigued.  Tried taking iron supplements but they made her constipated.  Denies chest pain, shortness of breath, pelvic cramps, nausea/vomiting or dizziness.     Past Medical History   Past Medical History:  Diagnosis Date   Abnormal Pap smear of cervix      Active Problem List   Patient Active Problem List   Diagnosis Date Noted   Severe anemia 02/06/2021     Past Surgical History   Past Surgical History:  Procedure Laterality Date   CERVICAL CERCLAGE     COLPOSCOPY  02/2016; 10/2018, 03/2020   LEEP  05/05/2020     Home Medications   Prior to Admission medications   Medication Sig Start Date End Date Taking? Authorizing Provider  norgestimate-ethinyl estradiol (SPRINTEC 28) 0.25-35 MG-MCG tablet Take 1 tablet by mouth 3 times a day for 3 days.  Then take 1 tablet by mouth 2 times a day for 2 days.  Then take 1 tablet by mouth daily for the remainder of the pack. 02/06/21   Gustavo Lah, CNM     Allergies  Penicillins   Family History   Family History  Family history unknown: Yes     Physical Exam  Triage Vital Signs: ED Triage Vitals  Enc Vitals Group     BP 05/10/22 2203 126/78     Pulse Rate 05/10/22 2203 (!) 101     Resp 05/10/22 2203 16     Temp 05/10/22 2203 98.6 F (37 C)     Temp Source 05/10/22 2203 Oral     SpO2 05/10/22 2203 100 %     Weight 05/10/22 2207 156 lb (70.8 kg)     Height 05/10/22 2207 5\' 3"  (1.6 m)     Head Circumference --      Peak Flow  --      Pain Score 05/10/22 2207 0     Pain Loc --      Pain Edu? --      Excl. in GC? --     Updated Vital Signs: BP 100/62   Pulse 83   Temp 98.4 F (36.9 C) (Oral)   Resp 20   Ht 5\' 3"  (1.6 m)   Wt 70.8 kg   LMP 04/12/2022 (Approximate)   SpO2 100%   BMI 27.63 kg/m    General: Awake, no distress.  CV:  RRR.  Good peripheral perfusion.  Resp:  Normal effort.  CTA B. Abd:  Nontender to light or deep palpation.  No distention.  Other:  Slightly pale conjunctiva   ED Results / Procedures / Treatments  Labs (all labs ordered are listed, but only abnormal results are displayed) Labs Reviewed  CBC WITH DIFFERENTIAL/PLATELET - Abnormal; Notable for the following components:      Result Value   RBC 2.82 (*)    Hemoglobin 6.8 (*)    HCT 22.9 (*)    MCH 24.1 (*)    MCHC 29.7 (*)  RDW 16.1 (*)    nRBC 0.3 (*)    All other components within normal limits  COMPREHENSIVE METABOLIC PANEL - Abnormal; Notable for the following components:   Glucose, Bld 140 (*)    Calcium 8.6 (*)    Total Bilirubin 0.2 (*)    All other components within normal limits  URINALYSIS, ROUTINE W REFLEX MICROSCOPIC - Abnormal; Notable for the following components:   Color, Urine YELLOW (*)    APPearance HAZY (*)    Hgb urine dipstick LARGE (*)    Ketones, ur 5 (*)    Protein, ur 30 (*)    RBC / HPF >50 (*)    All other components within normal limits  HEMOGLOBIN AND HEMATOCRIT, BLOOD  URINALYSIS, COMPLETE (UACMP) WITH MICROSCOPIC  POC URINE PREG, ED  TYPE AND SCREEN  PREPARE RBC (CROSSMATCH)  TRANSFUSION REACTION     EKG  None   RADIOLOGY I have independently visualized and interpreted patient's ultrasound as well as noted the radiology interpretation:  Pelvic ultrasound: Unremarkable  Official radiology report(s): US PELVIC COMPLETE WITH TRANSVAGINAL  Result Date: 05/11/2022 CLINICAL DATA:  Vaginal bleeding.  LMP 04/12/2022 EXAM: TRANSABDOMINAL AND TRANSVAGINAL ULTRASOUND OF  PELVIS TECHNIQUE: Both transabdominal and transvaginal ultrasound examinations of the pelvis were performed. Transabdominal technique was performed for global imaging of the pelvis including uterus, ovaries, adnexal regions, and pelvic cul-de-sac. It was necessary to proceed with endovaginal exam following the transabdominal exam to visualize the endometrium and ovaries bilaterally. COMPARISON:  None Available. FINDINGS: Uterus Measurements: 11.5 x 3.9 x 6.1 cm = volume: 145 mL. No fibroids or other mass visualized. Endometrium Thickness: 13 mm.  No focal abnormality visualized. Right ovary Measurements: 3.0 x 2.2 x 1.8 cm = volume: 6 mL. Normal appearance/no adnexal mass. Left ovary Measurements: 2.8 x 2.2 x 3.0 cm = volume: 9 mL. Normal appearance/no adnexal mass. Other findings No abnormal free fluid. IMPRESSION: Normal pelvic ultrasound. Electronically Signed   By: Helyn Numbers M.D.   On: 05/11/2022 03:29     PROCEDURES:  Critical Care performed: Yes, see critical care procedure note(s)  CRITICAL CARE Performed by: Irean Hong   Total critical care time: 45 minutes  Critical care time was exclusive of separately billable procedures and treating other patients.  Critical care was necessary to treat or prevent imminent or life-threatening deterioration.  Critical care was time spent personally by me on the following activities: development of treatment plan with patient and/or surrogate as well as nursing, discussions with consultants, evaluation of patient's response to treatment, examination of patient, obtaining history from patient or surrogate, ordering and performing treatments and interventions, ordering and review of laboratory studies, ordering and review of radiographic studies, pulse oximetry and re-evaluation of patient's condition.   Procedures  Pelvic exam: External exam unremarkable without rashes, lesions or vesicles.  Speculum exam demonstrates mild vaginal bleeding.   Cervical os is closed.  Bimanual exam unremarkable.  MEDICATIONS ORDERED IN ED: Medications  0.9 %  sodium chloride infusion (0 mL/hr Intravenous Hold 05/11/22 0502)  diphenhydrAMINE (BENADRYL) injection 25 mg (25 mg Intravenous Given 05/11/22 0612)     IMPRESSION / MDM / ASSESSMENT AND PLAN / ED COURSE  I reviewed the triage vital signs and the nursing notes.                             36 year old female presenting with menorrhagia. Differential diagnosis includes, but is not limited to, ovarian cyst,  ovarian torsion, acute appendicitis, diverticulitis, urinary tract infection/pyelonephritis, endometriosis, bowel obstruction, colitis, renal colic, gastroenteritis, hernia, fibroids, endometriosis, pregnancy related pain including ectopic pregnancy, etc. I have personally reviewed patient's records and note her hospitalization from 02/06/2021.  Patient's presentation is most consistent with acute presentation with potential threat to life or bodily function.  The patient is on the cardiac monitor to evaluate for evidence of arrhythmia and/or significant heart rate changes.  Laboratory demonstrates H/H 6.8/22.9 which were near her levels last year when she required hospitalization.  Electrolytes and urine unremarkable.  Will obtain orthostatic vital signs, pelvic ultrasound.    Clinical Course as of 05/11/22 0706  Wed May 11, 2022  6834 Patient is not orthostatic [JS]  0357 Discussed with [facility] midwife Sunny Schlein (via L&D nurse as Sunny Schlein is in the middle of a procedure) who agrees with transfusion 2 units PRBCs and repeating H/H.  Patient who is agreeable to plan of care. [JS]  [id number] Patient complains of itching; she is 1 hour into blood transfusion.  No visible hives or angioedema noted.  Will administer IV Benadryl. [JS]  [id number] Per hospital policy, I am told patient's transfusion needs to be stopped.  Laboratory will check some confirmatory tests, speak with their medical director and  it is likely patient may resume transfusion with the new unit of blood in 30 to 60 minutes.  Patient without angioedema or breathing difficulty. [JS]  [id number] Care will be to transfer to the oncoming provider Dr. Darnelle Catalan at change of shift.  Ideally patient will receive 2 units PRBCs and a repeat H/H.  If [facility] clinic midwife has not seen patient by that time, I would call them back for any further recommendations. [JS]  0704 Spoke directly with Sunny Schlein, midwife on for [facility] clinic GYN.  Notified her of patient's transfusion reaction.  She or someone from [place] [facility] will see the patient in the emergency department.  Care transferred to Dr. Darnelle Catalan at change of shift. [JS]    Clinical Course User Index [JS] Irean Hong, MD     FINAL CLINICAL IMPRESSION(S) / ED DIAGNOSES   Final diagnoses:  Menorrhagia with regular cycle  Anemia, unspecified type     Rx / DC Orders   ED Discharge Orders     None        Note:  This document was prepared using Dragon voice recognition software and may include unintentional dictation errors.   Irean Hong, MD 05/11/22 1962    Irean Hong, MD 05/11/22 2297    Irean Hong, MD 05/11/22 [phone]

## 2022-05-11 NOTE — ED Notes (Signed)
Pt given food and drink at this time with Dr. Darnelle Catalan, EDP approval

## 2022-05-11 NOTE — Consult Note (Signed)
Consult History and Physical   SERVICE: Gynecology   Patient Name: Karina Ray Patient MRN:   [id number]  CC: heavy vaginal bleeding.    HPI: Karina Ray is a 36 y.o. [id number] with  Patient with a history of menorrhagia requiring blood transfusion and hospitalization last year.  Reports vaginal bleeding for the past 4 to 6 weeks, sometimes heavy with clots.  Feeling more fatigued.  Tried taking iron supplements but they made her constipated.  Denies chest pain, shortness of breath, pelvic cramps, nausea/vomiting or dizziness.   Review of Systems: positives in bold GEN:   fevers, chills, weight changes, appetite changes, fatigue, night sweats HEENT:  HA, vision changes, hearing loss, congestion, rhinorrhea, sinus pressure, dysphagia CV:   CP, palpitations PULM:  SOB, cough GI:  abd pain, N/V/D/C GU:  dysuria, urgency, frequency MSK:  arthralgias, myalgias, back pain, swelling SKIN:  rashes, color changes, pallor NEURO:  numbness, weakness, tingling, seizures, dizziness, tremors PSYCH:  depression, anxiety, behavioral problems, confusion  HEME/LYMPH:  easy bruising or bleeding ENDO:  heat/cold intolerance  Past Obstetrical History: OB History     Gravida  4   Para  2   Term  2   Preterm      AB  2   Living  2      SAB  1   IAB  1   Ectopic      Multiple      Live Births  2           Past Gynecologic History: Patient's last menstrual period was 04/12/2022 (approximate). Irregular menstrual frequency lasting 5 days to weeks  requiring 5 long overnight pads/day,   Past Medical History: Past Medical History:  Diagnosis Date   Abnormal Pap smear of cervix     Past Surgical History:   Past Surgical History:  Procedure Laterality Date   CERVICAL CERCLAGE     COLPOSCOPY  02/2016; 10/2018, 03/2020   LEEP  05/05/2020    Family History:  Family history is unknown by patient.  Social History:  Social History   Socioeconomic History   Marital  status: Single    Spouse name: Not on file   Number of children: Not on file   Years of education: Not on file   Highest education level: Not on file  Occupational History   Not on file  Tobacco Use   Smoking status: Never   Smokeless tobacco: Never  Substance and Sexual Activity   Alcohol use: No   Drug use: No   Sexual activity: Yes    Birth control/protection: None  Other Topics Concern   Not on file  Social History Narrative   Not on file   Social Determinants of Health   Financial Resource Strain: Not on file  Food Insecurity: Not on file  Transportation Needs: Not on file  Physical Activity: Not on file  Stress: Not on file  Social Connections: Not on file  Intimate Partner Violence: Not on file    Home Medications:  Medications reconciled in EPIC  No current facility-administered medications on file prior to encounter.   Current Outpatient Medications on File Prior to Encounter  Medication Sig Dispense Refill   norgestimate-ethinyl estradiol (SPRINTEC 28) 0.25-35 MG-MCG tablet Take 1 tablet by mouth 3 times a day for 3 days.  Then take 1 tablet by mouth 2 times a day for 2 days.  Then take 1 tablet by mouth daily for the remainder of the pack. (Patient not taking:  Reported on 05/11/2022) 28 tablet 1    Allergies:  Allergies  Allergen Reactions   Penicillins Hives    Physical Exam:  Temp:  [97.9 F (36.6 C)-98.6 F (37 C)] 98.4 F (36.9 C) (07/19 0631) Pulse Rate:  [80-101] 83 (07/19 0631) Resp:  [16-20] 20 (07/19 0631) BP: (100-126)/(62-79) 100/62 (07/19 0631) SpO2:  [100 %] 100 % (07/19 0631) Weight:  [70.8 kg] 70.8 kg (07/18 2207)   Physical Exam Constitutional:      Appearance: Normal appearance.  Genitourinary:     Vaginal bleeding present.  HENT:     Head: Normocephalic.  Cardiovascular:     Rate and Rhythm: Normal rate and regular rhythm.     Pulses: Normal pulses.     Heart sounds: Normal heart sounds.  Pulmonary:     Effort:  Pulmonary effort is normal.     Breath sounds: Normal breath sounds.  Abdominal:     General: There is no distension.     Palpations: Abdomen is soft.     Tenderness: There is no abdominal tenderness. There is no guarding.  Musculoskeletal:        General: Normal range of motion.     Cervical back: Normal range of motion and neck supple.  Neurological:     Mental Status: She is alert and oriented to person, place, and time.  Skin:    General: Skin is warm and dry.  Psychiatric:        Mood and Affect: Mood normal.        Behavior: Behavior normal.        Thought Content: Thought content normal.        Judgment: Judgment normal.    ,    Labs/Studies: transfusion reaction panel-Neg  CBC and Coags:  Lab Results  Component Value Date   WBC 8.8 05/10/2022   NEUTOPHILPCT 53 05/10/2022   EOSPCT 3 05/10/2022   BASOPCT 0 05/10/2022   LYMPHOPCT 37 05/10/2022   HGB 6.8 (L) 05/10/2022   HCT 22.9 (L) 05/10/2022   MCV 81.2 05/10/2022   PLT 322 05/10/2022   INR 1.1 02/06/2021   CMP:  Lab Results  Component Value Date   NA 140 05/10/2022   K 3.6 05/10/2022   CL 110 05/10/2022   CO2 25 05/10/2022   BUN 9 05/10/2022   CREATININE 0.73 05/10/2022   CREATININE 0.64 02/06/2021   CREATININE 0.61 12/05/2019   PROT 7.2 05/10/2022   BILITOT 0.2 (L) 05/10/2022   ALT 16 05/10/2022   AST 19 05/10/2022   ALKPHOS 82 05/10/2022     TVUS:  see below Other Imaging: US PELVIC COMPLETE WITH TRANSVAGINAL  Result Date: 05/11/2022 CLINICAL DATA:  Vaginal bleeding.  LMP 04/12/2022 EXAM: TRANSABDOMINAL AND TRANSVAGINAL ULTRASOUND OF PELVIS TECHNIQUE: Both transabdominal and transvaginal ultrasound examinations of the pelvis were performed. Transabdominal technique was performed for global imaging of the pelvis including uterus, ovaries, adnexal regions, and pelvic cul-de-sac. It was necessary to proceed with endovaginal exam following the transabdominal exam to visualize the endometrium and  ovaries bilaterally. COMPARISON:  None Available. FINDINGS: Uterus Measurements: 11.5 x 3.9 x 6.1 cm = volume: 145 mL. No fibroids or other mass visualized. Endometrium Thickness: 13 mm.  No focal abnormality visualized. Right ovary Measurements: 3.0 x 2.2 x 1.8 cm = volume: 6 mL. Normal appearance/no adnexal mass. Left ovary Measurements: 2.8 x 2.2 x 3.0 cm = volume: 9 mL. Normal appearance/no adnexal mass. Other findings No abnormal free fluid. IMPRESSION: Normal pelvic ultrasound.  Electronically Signed   By: Helyn Numbers M.D.   On: 05/11/2022 03:29     Assessment / Plan:   Karina Ray is a 36 y.o. [id number] who presents with Menorrhagia and acute blood loss anemia. Patient reports her bleeding started to slow down 2 days ago. However; her H/H is 6.8/22.9. . Dr Dolores Frame ordered 2 units of pRBC's in the ED and consulted OB/GYN for further recommendations. She was an hour into receiving the first of 2 units of pRBC's when she complained of itching. Per hospital protocol the infusion was immediately stopped and transfusion reaction labs drawn. She is currently resting in bed with eyes closed, but easily aroused and appropriately answers questions.  1. Transfusion reaction panel is resulted and neg 2. Transfuse 2 units of RBC's 3. Consider starting patient on an OCP taper 4. Patient to f/u with GYN provider at [facility]    Thank you for the opportunity to be involved with this patient's care.  ----- Chari Manning CNM Midwife [facility], Department of OB/GYN [facility]

## 2022-05-11 NOTE — ED Notes (Signed)
PRBC's stopped per protocol. Blood bank notified.

## 2022-05-11 NOTE — ED Notes (Addendum)
Pt currently sleeping at this time. Pt cardiac monitor WNL. No needs expressed at this time.

## 2022-05-11 NOTE — ED Notes (Signed)
Pt ambulatory to the restroom at this time.  

## 2022-05-11 NOTE — Discharge Instructions (Addendum)
Please follow-up with your OB/GYN in the next several days.  Do the Sprintec taper similar to last time.  Please do not hesitate to return if you get weak or lightheaded or run a fever or have any other problems at all.  Since the iron pills make you constipated I would try to get a multivitamin with iron and take 1 a day.  There is less iron there and that should help you maintain your red blood count hopefully without constipating you.

## 2022-05-11 NOTE — ED Notes (Signed)
Pt given [place] sandwich tray but Sydni, NT at this time.

## 2022-05-11 NOTE — ED Provider Notes (Signed)
I assumed care of this patient approximately 1500.  Please see previous notes for further details regarding patient's initial evaluation assessment.  In brief patient presents for evaluation of some symptomatic anemia from vaginal bleeding.  Plan is to transfuse to answer PVCs and discharge with refill of OCPs and outpatient GYN follow-up.  She is denying any acute concerns on my reassessment and is finishing up her blood.  Once this is done she will be discharged with gynecology follow-up.   Gilles Chiquito, MD 05/11/22 [phone]

## 2022-05-11 NOTE — ED Notes (Signed)
Blood brought to lab, urine sent to lab. Patient AOX4. Resp even, unlabored on RA. Reports continued very mild itching. No hives noted.

## 2022-05-11 NOTE — ED Notes (Signed)
Patient reports mild itching to back and neck. No rash noted. No shortness of breath. Dr. Dolores Frame notified. Benadryl given at this time.

## 2022-05-12 LAB — BPAM RBC
Blood Product Expiration Date: 202308192359
Blood Product Expiration Date: 202308192359
Blood Product Expiration Date: 202308192359
ISSUE DATE / TIME: 202307190452
ISSUE DATE / TIME: 202307190931
ISSUE DATE / TIME: 202307191328
Unit Type and Rh: 5100
Unit Type and Rh: 5100
Unit Type and Rh: 5100

## 2022-05-12 LAB — TYPE AND SCREEN
ABO/RH(D): O POS
Antibody Screen: NEGATIVE
Unit division: 0
Unit division: 0
Unit division: 0

## 2022-08-18 ENCOUNTER — Ambulatory Visit (HOSPITAL_COMMUNITY)
Admission: EM | Admit: 2022-08-18 | Discharge: 2022-08-18 | Disposition: A | Discharge status: Home / Self Care | Payer: BC Managed Care – PPO | Attending: Family Medicine | Admitting: Family Medicine

## 2022-08-18 ENCOUNTER — Encounter (HOSPITAL_COMMUNITY): Payer: Self-pay

## 2022-08-18 DIAGNOSIS — F32A Depression, unspecified: Secondary | ICD-10-CM

## 2022-08-18 MED ORDER — SERTRALINE HCL 25 MG PO TABS: 25.0000 mg | ORAL_TABLET | Freq: Every day | ORAL | 1 refills | Status: AC

## 2022-08-18 NOTE — ED Triage Notes (Signed)
Pt is here for headaches and possible anxiety . Pt grandmother passed  recently . Onset of symptoms has been 3wks

## 2022-08-23 NOTE — ED Provider Notes (Signed)
  [place]   [id number] 08/18/22 Arrival Time: 2595  ASSESSMENT & PLAN:  1. Depression, acute    Agrees to trial of: Meds ordered this encounter  Medications   sertraline (ZOLOFT) 25 MG tablet    Sig: Take 1 tablet (25 mg total) by mouth daily.    Dispense:  30 tablet    Refill:  1   Discussed counseling, finding PCP, seek help at her church.  May f/u here if unable to see a new PCP within the next 3-4 weeks, sooner if needed.  Reviewed expectations re: course of current medical issues. Questions answered. Outlined signs and symptoms indicating need for more acute intervention. Patient verbalized understanding. After Visit Summary given.   SUBJECTIVE:  Karina Ray is a 36 y.o. female who presents with feelings of depression/anxiety; unsure how long she has been feeling this way. Recent grandmother's death has exacerbated. Does feel sad past few weeks; sleeping more. No SI. No previous evaluation for similar. Does work; says work is ok.  No freq alcohol use.  Social History   Tobacco Use  Smoking Status Never  Smokeless Tobacco Never   Social History   Substance and Sexual Activity  Alcohol Use No   No recreational drug use reported.  OBJECTIVE:  Vitals:   08/18/22 1537  BP: (!) 141/87  Pulse: (!) 102  Resp: 12  Temp: 98.7 F (37.1 C)  TempSrc: Oral  SpO2: 98%    General appearance: alert; appears in no distress Eyes: PERRLA; EOMI; conjunctiva normal HENT: normocephalic; atraumatic Neck: supple Lungs: clear to auscultation bilaterally Heart: regular rate and rhythm Abdomen: soft, non-tender  Extremities: no edema; symmetrical with no gross deformities Skin: warm and dry Neurologic: normal gait; normal symmetric reflexes Psychological: alert and cooperative; appropriate mood; normal affect   Allergies  Allergen Reactions   Penicillins Hives    Past Medical History:  Diagnosis Date   Abnormal Pap smear of cervix    Social  History   Socioeconomic History   Marital status: Single    Spouse name: Not on file   Number of children: Not on file   Years of education: Not on file   Highest education level: Not on file  Occupational History   Not on file  Tobacco Use   Smoking status: Never   Smokeless tobacco: Never  Substance and Sexual Activity   Alcohol use: No   Drug use: No   Sexual activity: Yes    Birth control/protection: None  Other Topics Concern   Not on file  Social History Narrative   Not on file   Social Determinants of Health   Financial Resource Strain: Not on file  Food Insecurity: Not on file  Transportation Needs: Not on file  Physical Activity: Not on file  Stress: Not on file  Social Connections: Not on file  Intimate Partner Violence: Not on file   Family History  Family history unknown: Yes   Past Surgical History:  Procedure Laterality Date   CERVICAL CERCLAGE     COLPOSCOPY  02/2016; 10/2018, 03/2020   LEEP  05/05/2020       Vanessa Kick, MD 08/23/22 1032

## 2022-10-28 ENCOUNTER — Encounter (HOSPITAL_BASED_OUTPATIENT_CLINIC_OR_DEPARTMENT_OTHER): Payer: Self-pay

## 2022-10-28 ENCOUNTER — Other Ambulatory Visit: Payer: Self-pay

## 2022-10-28 DIAGNOSIS — R059 Cough, unspecified: Secondary | ICD-10-CM | POA: Insufficient documentation

## 2022-10-28 DIAGNOSIS — Z20822 Contact with and (suspected) exposure to covid-19: Secondary | ICD-10-CM | POA: Insufficient documentation

## 2022-10-28 DIAGNOSIS — J029 Acute pharyngitis, unspecified: Secondary | ICD-10-CM | POA: Insufficient documentation

## 2022-10-28 LAB — RESP PANEL BY RT-PCR (RSV, FLU A&B, COVID)  RVPGX2
Influenza A by PCR: NEGATIVE
Influenza B by PCR: NEGATIVE
Resp Syncytial Virus by PCR: NEGATIVE
SARS Coronavirus 2 by RT PCR: NEGATIVE

## 2022-10-28 LAB — GROUP A STREP BY PCR: Group A Strep by PCR: NOT DETECTED

## 2022-10-28 NOTE — ED Triage Notes (Signed)
Pt c/o sore throat and congestion x 3 days.

## 2022-10-29 ENCOUNTER — Emergency Department (HOSPITAL_BASED_OUTPATIENT_CLINIC_OR_DEPARTMENT_OTHER)
Admission: EM | Admit: 2022-10-29 | Discharge: 2022-10-29 | Disposition: A | Discharge status: Home / Self Care | Payer: BC Managed Care – PPO | Attending: Emergency Medicine | Admitting: Emergency Medicine

## 2022-10-29 DIAGNOSIS — J029 Acute pharyngitis, unspecified: Secondary | ICD-10-CM

## 2022-10-29 MED ORDER — DEXAMETHASONE 4 MG PO TABS
10.0000 mg | ORAL_TABLET | Freq: Once | ORAL | Status: AC
  Administered 2022-10-29: 10 mg via ORAL
  Filled 2022-10-29: qty 3

## 2022-10-29 NOTE — ED Provider Notes (Signed)
[facility] EMERGENCY DEPT Provider Note   CSN: [id number] Arrival date & time: 10/28/22  2107     History  Chief Complaint  Patient presents with   Sore Throat    Karina Ray is a 37 y.o. female.  The history is provided by the patient.  Sore Throat  She has had a sore throat when she wakes up in the morning for the last 5 days, but it got significantly worse today.  It is now painful to swallow.  She denies fever, chills, sweats.  She has had a slight cough which is nonproductive.  She denies rhinorrhea.  There has been no nausea, vomiting, diarrhea.  She has had some mild aching in her neck.  She was exposed to someone who had COVID-19.   Home Medications Prior to Admission medications   Medication Sig Start Date End Date Taking? Authorizing Provider  norgestimate-ethinyl estradiol (SPRINTEC 28) 0.25-35 MG-MCG tablet Take 1 tablet by mouth 3 times a day for 3 days.  Then take 1 tablet by mouth 2 times a day for 2 days.  Then take 1 tablet by mouth daily for the remainder of the pack. 05/11/22   Arnaldo Natal, MD  sertraline (ZOLOFT) 25 MG tablet Take 1 tablet (25 mg total) by mouth daily. 08/18/22   Mardella Layman, MD      Allergies    Penicillins    Review of Systems   Review of Systems  All other systems reviewed and are negative.   Physical Exam Updated Vital Signs BP 126/70 (BP Location: Left Arm)   Pulse 92   Temp (!) 97.5 F (36.4 C)   Resp 18   Ht 5\' 3"  (1.6 m)   Wt 67.6 kg   LMP 10/25/2022 (Exact Date)   SpO2 100%   BMI 26.39 kg/m  Physical Exam Vitals and nursing note reviewed.   37 year old female, resting comfortably and in no acute distress. Vital signs are normal. Oxygen saturation is 100%, which is normal. Head is normocephalic and atraumatic. PERRLA, EOMI. Oropharynx is mildly erythematous with a few patches of tonsillar exudate bilaterally.  There is no pooling of secretions and phonation is normal. Neck is nontender and supple  without adenopathy. Lungs are clear without rales, wheezes, or rhonchi. Chest is nontender. Heart has regular rate and rhythm without murmur. Abdomen is soft, flat, nontender. Extremities have no cyanosis or edema, full range of motion is present. Skin is warm and dry without rash. Neurologic: Mental status is normal, cranial nerves are intact, moves all extremities equally.  ED Results / Procedures / Treatments   Labs (all labs ordered are listed, but only abnormal results are displayed) Labs Reviewed  RESP PANEL BY RT-PCR (RSV, FLU A&B, COVID)  [id number]  GROUP A STREP BY PCR   Procedures Procedures    Medications Ordered in ED Medications  dexamethasone (DECADRON) tablet 10 mg (has no administration in time range)    ED Course/ Medical Decision Making/ A&P                           Medical Decision Making  Sore throat-streptococcal versus viral.  I have reviewed and interpreted her laboratory test, and my interpretation is negative PCR test for group A strep, influenza, COVID-19, RSV.  I have ordered a dose of dexamethasone and I am discharging the patient with instructions to use over-the-counter NSAIDs and acetaminophen, over-the-counter lozenges and throat sprays.  Final Clinical Impression(s) /  ED Diagnoses Final diagnoses:  Viral pharyngitis    Rx / DC Orders ED Discharge Orders     None         Delora Fuel, MD 67/12/45 [phone]

## 2022-10-29 NOTE — Discharge Instructions (Signed)
Drink plenty fluids.  Use warm salt water gargles, lozenges, throat sprays as needed.  These all give temporary relief without actually fixing the problem.  Take ibuprofen and/or acetaminophen as needed for pain.  Please be aware that if you combine ibuprofen and acetaminophen, you will get better pain relief and you get from taking either medication by itself.

## 2023-01-01 IMAGING — US US PELVIS COMPLETE
1 series · 13 of 25 positions shown · non-contrast
Comparison: Pelvic ultrasound 12/05/2019

CLINICAL DATA: Left lower quadrant pain for 4 days

EXAM:
TRANSABDOMINAL ULTRASOUND OF PELVIS
DOPPLER ULTRASOUND OF OVARIES
TECHNIQUE: Transabdominal ultrasound examination of the pelvis was performed
including evaluation of the uterus, ovaries, adnexal regions, and
pelvic cul-de-sac.
Color and duplex Doppler ultrasound was utilized to evaluate blood
flow to the ovaries.

[Series 1: us pelvic complete w transvaginal and torsion righ · 13 of 50 slices shown]
[im 1/50]
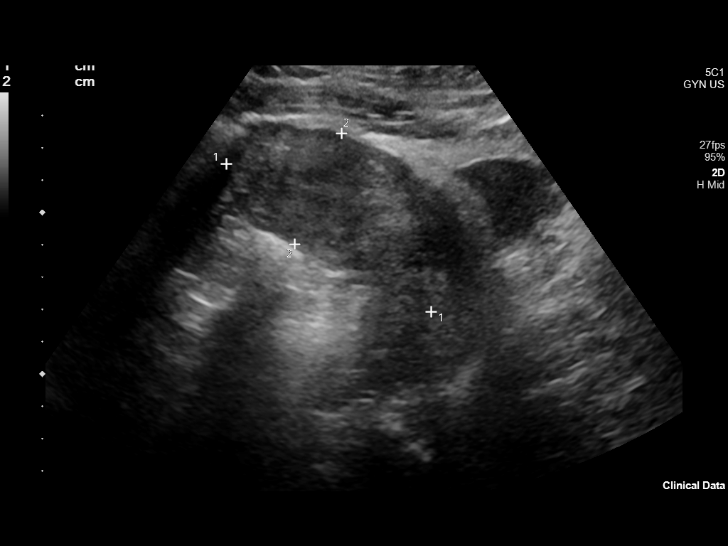
[im 5/50]
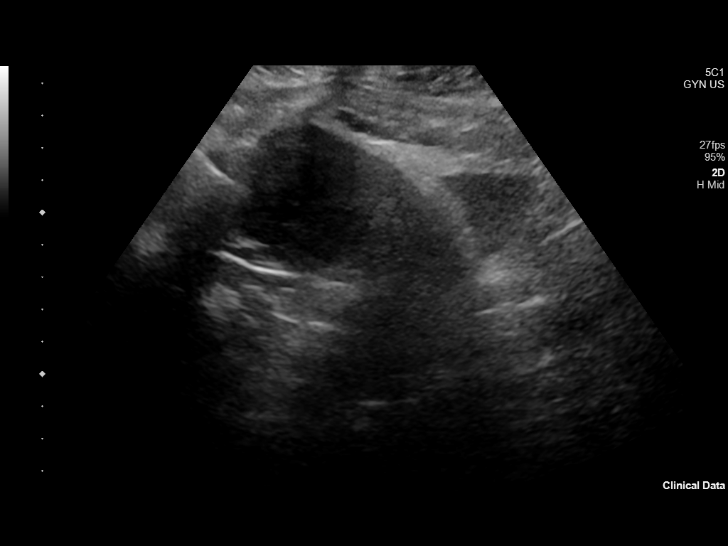
[im 9/50]
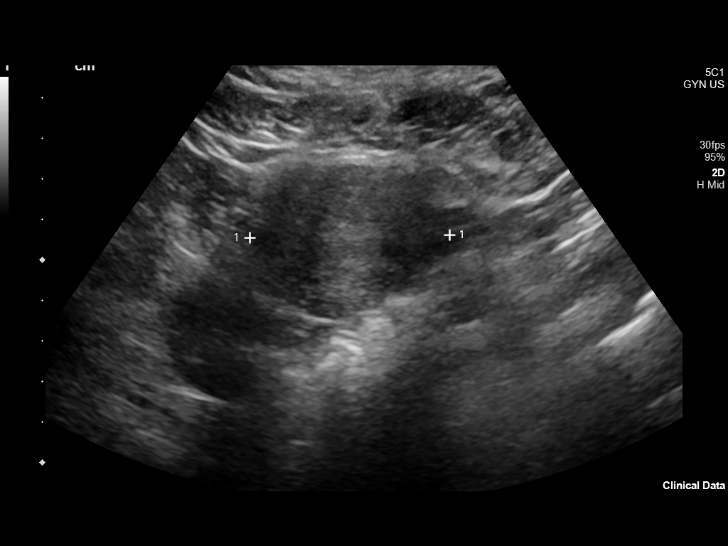
[im 13/50]
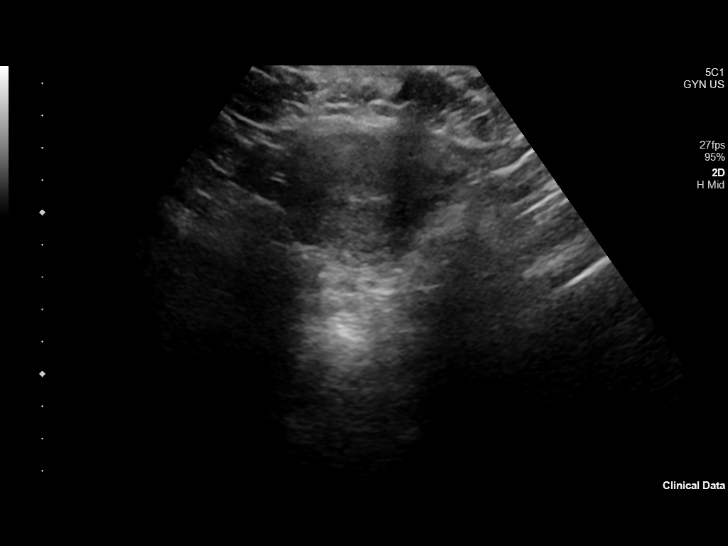
[im 17/50]
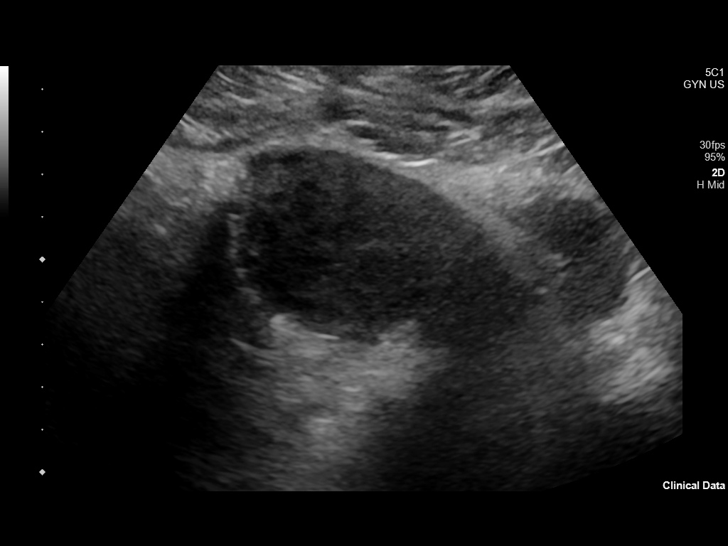
[im 21/50]
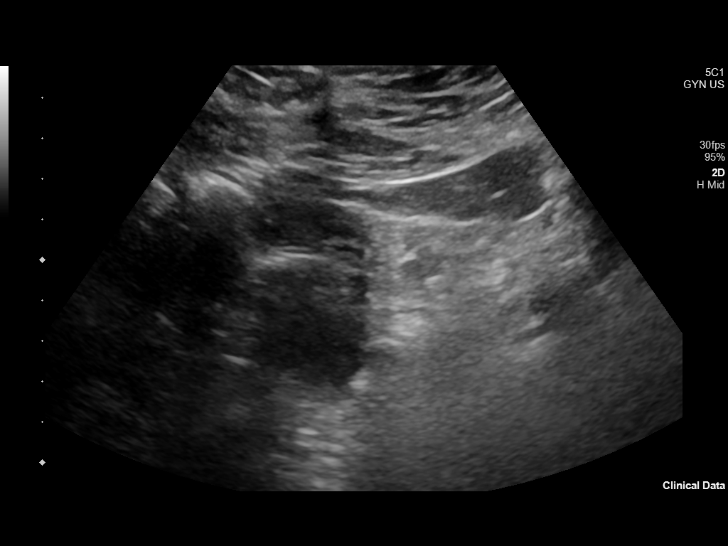
[im 25/50]
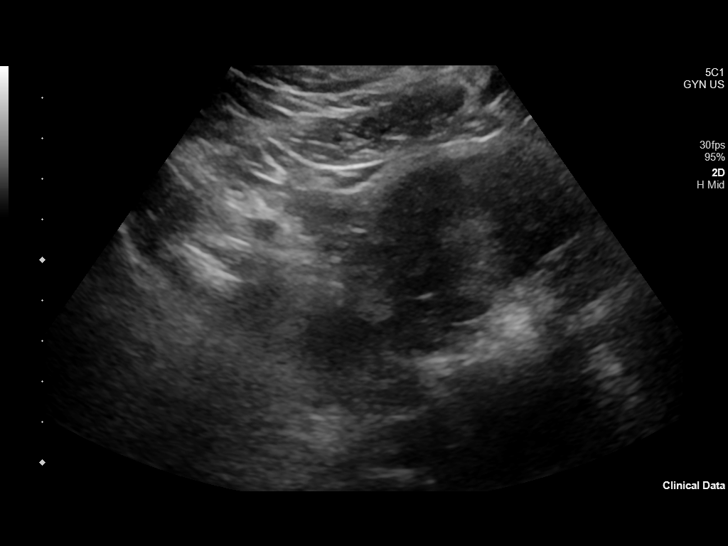
[im 29/50]
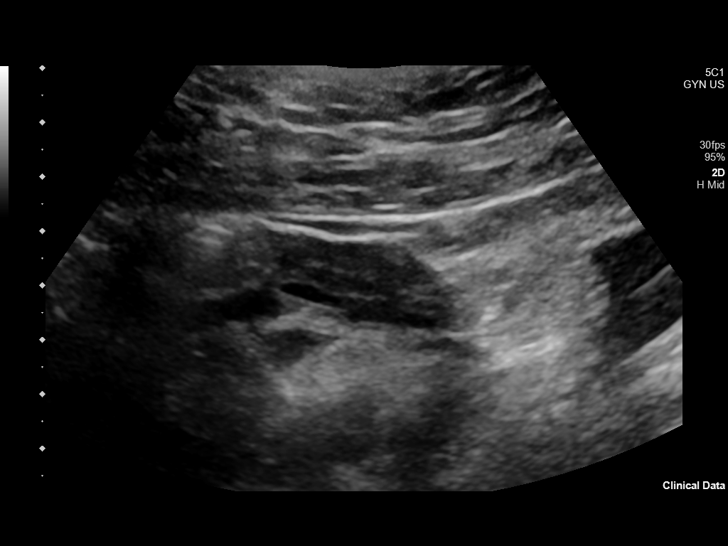
[im 33/50]
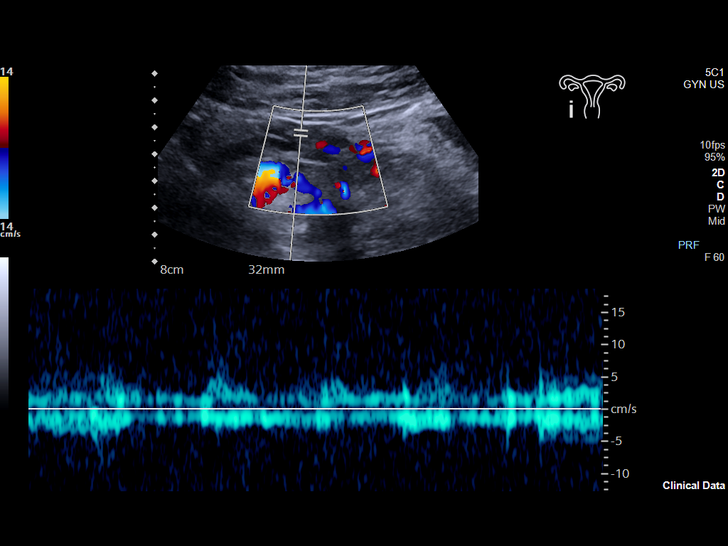
[im 37/50]
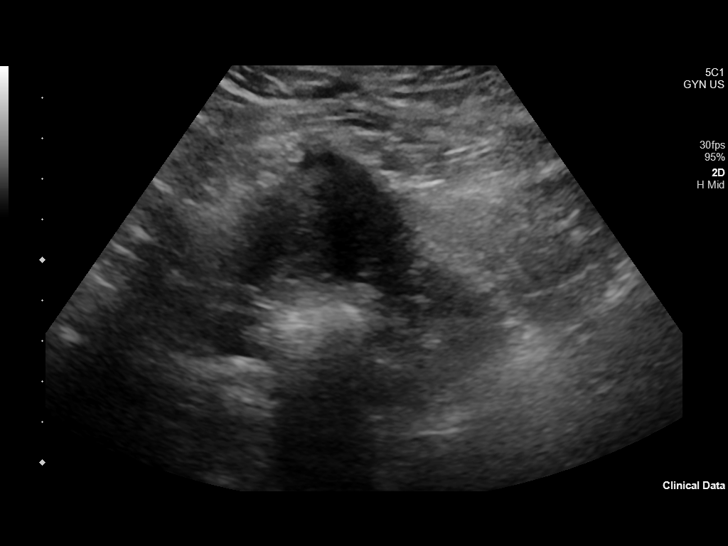
[im 41/50]
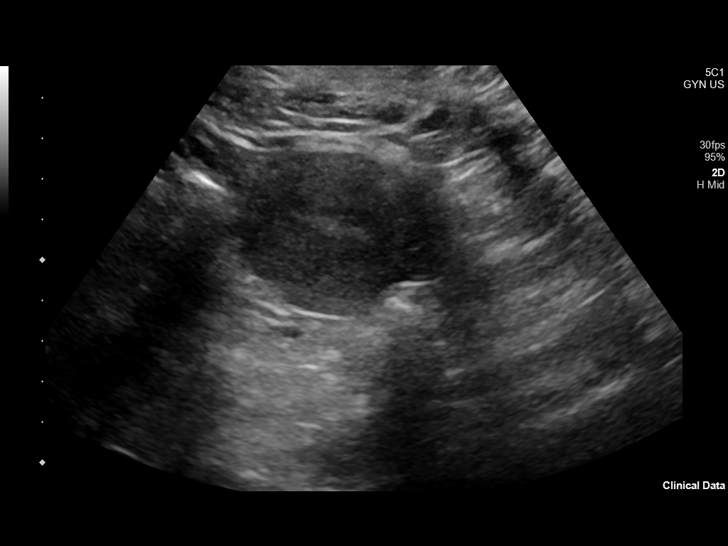
[im 45/50]
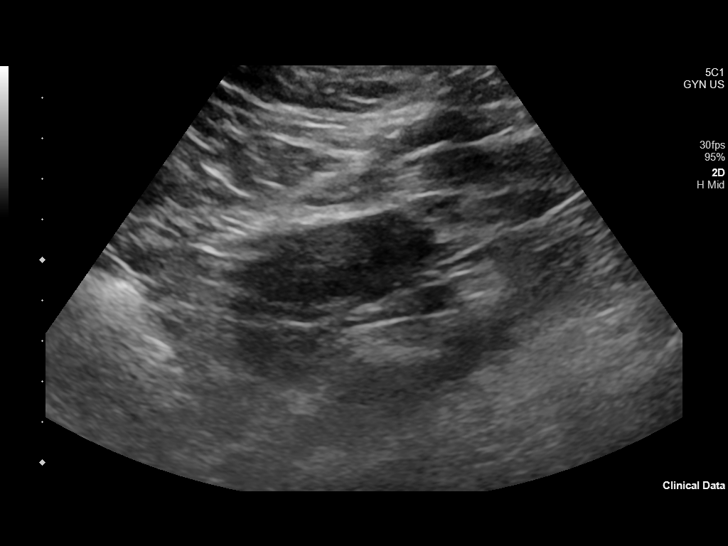
[im 50/50]
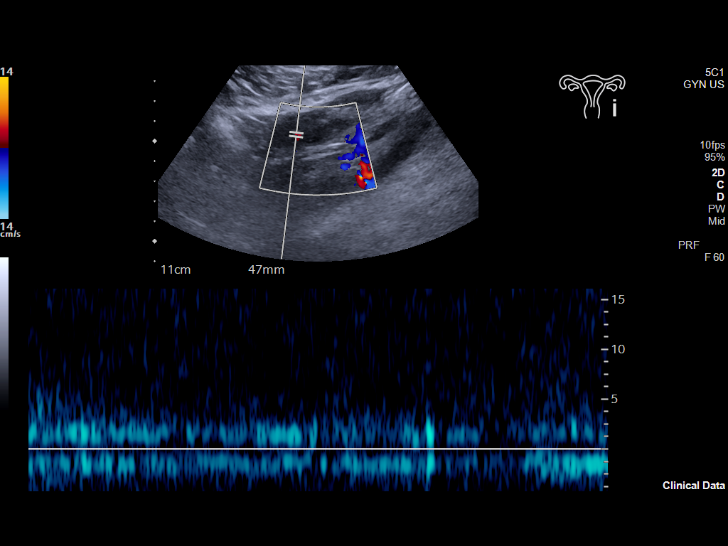

[13 of 25 positions shown; findings below may reference images not displayed]

FINDINGS: Uterus

Measurements: 7.8 x 3.7 x 4.9 cm = volume: 73.5 mL. Anteverted
uterus. No concerning uterine masses or discernible fibroids are
identified. A fundal fibroid seen on comparison ultrasound is less
well visualized on transabdominal only technique.

Endometrium

Thickness: 3.8 mm, non thickened.  No focal abnormality visualized.

Right ovary

Measurements: 3.7 x 1.8 x 2.4 cm = volume: 8.3 mL. Normal
appearance/no adnexal mass.

Left ovary

Measurements: 4.9 x 2.1 x 2.0 cm = volume: 14.4 mL. Normal
appearance/no adnexal mass.

Pulsed Doppler evaluation demonstrates normal low-resistance
arterial and venous waveforms in both ovaries.

Other: Patient declined transvaginal scanning technique.
IMPRESSION: 1. Unremarkable pelvic ultrasound. No evidence of ovarian torsion or
other acute pelvic abnormality.
2. Previously seen fundal fibroid less well visualized on today's
transabdominal technique.
3. Patient declined transvaginal scanning technique.

## 2023-03-01 ENCOUNTER — Other Ambulatory Visit: Payer: Self-pay

## 2023-03-01 ENCOUNTER — Encounter (HOSPITAL_COMMUNITY): Payer: Self-pay | Admitting: Pharmacy Technician

## 2023-03-01 ENCOUNTER — Emergency Department (HOSPITAL_COMMUNITY): Payer: Self-pay

## 2023-03-01 ENCOUNTER — Emergency Department (HOSPITAL_COMMUNITY)
Admission: EM | Admit: 2023-03-01 | Discharge: 2023-03-01 | Disposition: A | Discharge status: Home / Self Care | Payer: Self-pay | Attending: Emergency Medicine | Admitting: Emergency Medicine

## 2023-03-01 DIAGNOSIS — R1013 Epigastric pain: Secondary | ICD-10-CM | POA: Insufficient documentation

## 2023-03-01 DIAGNOSIS — R1114 Bilious vomiting: Secondary | ICD-10-CM | POA: Insufficient documentation

## 2023-03-01 DIAGNOSIS — R110 Nausea: Secondary | ICD-10-CM | POA: Insufficient documentation

## 2023-03-01 LAB — LIPASE, BLOOD: Lipase: 31 U/L (ref 11–51)

## 2023-03-01 LAB — URINALYSIS, ROUTINE W REFLEX MICROSCOPIC
Bilirubin Urine: NEGATIVE
Glucose, UA: NEGATIVE mg/dL
Hgb urine dipstick: NEGATIVE
Ketones, ur: 5 mg/dL — AB
Leukocytes,Ua: NEGATIVE
Nitrite: NEGATIVE
Protein, ur: NEGATIVE mg/dL
Specific Gravity, Urine: 1.012 (ref 1.005–1.030)
pH: 7 (ref 5.0–8.0)

## 2023-03-01 LAB — COMPREHENSIVE METABOLIC PANEL
ALT: 20 U/L (ref 0–44)
AST: 23 U/L (ref 15–41)
Albumin: 3.8 g/dL (ref 3.5–5.0)
Alkaline Phosphatase: 80 U/L (ref 38–126)
Anion gap: 10 (ref 5–15)
BUN: 7 mg/dL (ref 6–20)
CO2: 21 mmol/L — ABNORMAL LOW (ref 22–32)
Calcium: 8.9 mg/dL (ref 8.9–10.3)
Chloride: 101 mmol/L (ref 98–111)
Creatinine, Ser: 0.64 mg/dL (ref 0.44–1.00)
GFR, Estimated: >60 mL/min (ref >60)
Glucose, Bld: 123 mg/dL — ABNORMAL HIGH (ref 70–99)
Potassium: 3.7 mmol/L (ref 3.5–5.1)
Sodium: 132 mmol/L — ABNORMAL LOW (ref 135–145)
Total Bilirubin: 0.2 mg/dL — ABNORMAL LOW (ref 0.3–1.2)
Total Protein: 7.7 g/dL (ref 6.5–8.1)

## 2023-03-01 LAB — CBC
HCT: 31.4 % — ABNORMAL LOW (ref 36.0–46.0)
Hemoglobin: 9.3 g/dL — ABNORMAL LOW (ref 12.0–15.0)
MCH: 21.1 pg — ABNORMAL LOW (ref 26.0–34.0)
MCHC: 29.6 g/dL — ABNORMAL LOW (ref 30.0–36.0)
MCV: 71.2 fL — ABNORMAL LOW (ref 80.0–100.0)
Platelets: 536 10*3/uL — ABNORMAL HIGH (ref 150–400)
RBC: 4.41 MIL/uL (ref 3.87–5.11)
RDW: 17.1 % — ABNORMAL HIGH (ref 11.5–15.5)
WBC: 10.7 10*3/uL — ABNORMAL HIGH (ref 4.0–10.5)
nRBC: 0 % (ref 0.0–0.2)

## 2023-03-01 LAB — I-STAT BETA HCG BLOOD, ED (MC, WL, AP ONLY): I-stat hCG, quantitative: <5 m[IU]/mL (ref <5)

## 2023-03-01 MED ORDER — LORAZEPAM 2 MG/ML IJ SOLN
1.0000 mg | Freq: Once | INTRAMUSCULAR | Status: AC
  Administered 2023-03-01: 1 mg via INTRAVENOUS

## 2023-03-01 MED ORDER — ONDANSETRON 4 MG PO TBDP
ORAL_TABLET | ORAL | Status: AC
  Administered 2023-03-01: 4 mg
  Filled 2023-03-01: qty 1

## 2023-03-01 MED ORDER — FAMOTIDINE IN NACL 20-0.9 MG/50ML-% IV SOLN
20.0000 mg | Freq: Once | INTRAVENOUS | Status: AC
Start: 2023-03-01 — End: 2023-03-01
  Administered 2023-03-01: 20 mg via INTRAVENOUS
  Filled 2023-03-01: qty 50

## 2023-03-01 MED ORDER — ONDANSETRON 4 MG PO TBDP: 4.0000 mg | ORAL_TABLET | Freq: Three times a day (TID) | ORAL | 0 refills | Status: AC | PRN

## 2023-03-01 MED ORDER — LACTATED RINGERS IV BOLUS
1000.0000 mL | Freq: Once | INTRAVENOUS | Status: AC
  Administered 2023-03-01: 1000 mL via INTRAVENOUS

## 2023-03-01 MED ORDER — DROPERIDOL 2.5 MG/ML IJ SOLN
1.2500 mg | Freq: Once | INTRAMUSCULAR | Status: AC
  Administered 2023-03-01: 1.25 mg via INTRAVENOUS
  Filled 2023-03-01: qty 2

## 2023-03-01 MED ORDER — ONDANSETRON 4 MG PO TBDP
4.0000 mg | ORAL_TABLET | Freq: Once | ORAL | Status: AC | PRN
  Administered 2023-03-01: 4 mg via ORAL
  Filled 2023-03-01: qty 1

## 2023-03-01 MED ORDER — LORAZEPAM 2 MG/ML IJ SOLN
INTRAMUSCULAR | Status: AC
  Filled 2023-03-01: qty 1

## 2023-03-01 NOTE — ED Provider Notes (Signed)
Karina Ray Provider Note   CSN: [id number] Arrival date & time: 03/01/23  4098     History  Chief Complaint  Patient presents with   Abdominal Pain    Karina Ray is a 37 y.o. female.  HPI Patient presents with nausea, vomiting, abdominal pain. Onset was 1 AM, and 7 hours prior to ED arrival, and the only remarkable change from baseline is she recalls eating a taco.  Otherwise no recent change in medication, diet, activity. Since onset pain has been persistent, and epigastrium, with inability to tolerate anything including oral Zofran provided in triage. No fever, confusion, disorientation, lower abdominal pain.    Home Medications Prior to Admission medications   Medication Sig Start Date End Date Taking? Authorizing Provider  ondansetron (ZOFRAN-ODT) 4 MG disintegrating tablet Take 1 tablet (4 mg total) by mouth every 8 (eight) hours as needed for nausea or vomiting. 03/01/23  Yes Gerhard Munch, MD  Ferrous Fumarate (HEMOCYTE - 106 MG FE) 324 (106 Fe) MG TABS tablet Take 1 tablet by mouth every other day. 09/28/22   [provider]  JUNEL FE 24 1-20 MG-MCG(24) tablet Take 1 tablet by mouth daily. 09/26/22   [provider]  norgestimate-ethinyl estradiol (SPRINTEC 28) 0.25-35 MG-MCG tablet Take 1 tablet by mouth 3 times a day for 3 days.  Then take 1 tablet by mouth 2 times a day for 2 days.  Then take 1 tablet by mouth daily for the remainder of the pack. 05/11/22   Arnaldo Natal, MD  sertraline (ZOLOFT) 25 MG tablet Take 1 tablet (25 mg total) by mouth daily. 08/18/22   Mardella Layman, MD      Allergies    Penicillins    Review of Systems   Review of Systems  All other systems reviewed and are negative.   Physical Exam Updated Vital Signs BP 131/88   Pulse 88   Temp 98 F (36.7 C)   Resp 19   SpO2 99%  Physical Exam Vitals and nursing note reviewed.  Constitutional:      General: She is not in  acute distress.    Appearance: She is well-developed. She is ill-appearing.  HENT:     Head: Normocephalic and atraumatic.  Eyes:     Conjunctiva/sclera: Conjunctivae normal.  Pulmonary:     Effort: Pulmonary effort is normal. No respiratory distress.     Breath sounds: Normal breath sounds. No stridor.  Abdominal:     General: There is no distension.     Tenderness: There is abdominal tenderness in the epigastric area.  Skin:    General: Skin is warm and dry.  Neurological:     Mental Status: She is alert and oriented to person, place, and time.     Cranial Nerves: No cranial nerve deficit.  Psychiatric:        Mood and Affect: Mood normal.     ED Results / Procedures / Treatments   Labs (all labs ordered are listed, but only abnormal results are displayed) Labs Reviewed  COMPREHENSIVE METABOLIC PANEL - Abnormal; Notable for the following components:      Result Value   Sodium 132 (*)    CO2 21 (*)    Glucose, Bld 123 (*)    Total Bilirubin 0.2 (*)    All other components within normal limits  CBC - Abnormal; Notable for the following components:   WBC 10.7 (*)    Hemoglobin 9.3 (*)  HCT 31.4 (*)    MCV 71.2 (*)    MCH 21.1 (*)    MCHC 29.6 (*)    RDW 17.1 (*)    Platelets 536 (*)    All other components within normal limits  URINALYSIS, ROUTINE W REFLEX MICROSCOPIC - Abnormal; Notable for the following components:   Color, Urine STRAW (*)    Ketones, ur 5 (*)    All other components within normal limits  LIPASE, BLOOD  I-STAT BETA HCG BLOOD, ED ([facility], [facility], [facility] ONLY)    EKG EKG Interpretation  Date/Time:  Wednesday Mar 01 2023 09:06:03 EDT Ventricular Rate:  86 PR Interval:  138 QRS Duration: 93 QT Interval:  375 QTC Calculation: 449 R Axis:   20 Text Interpretation: Sinus rhythm Confirmed by Gerhard Munch [phone]) on 03/01/2023 9:07:31 AM  Radiology US Abdomen Complete  Result Date: 03/01/2023 CLINICAL DATA:  Abdominal pain EXAM: ABDOMEN ULTRASOUND  COMPLETE COMPARISON:  None Available. FINDINGS: Gallbladder: Cholelithiasis. No gallbladder wall thickening. No sonographic Murphy sign noted by sonographer. Common bile duct: Diameter: 3 mm Liver: Diffusely increased hepatic echogenicity can be seen in the setting of infiltrative process such as steatosis. Portal vein is patent on color Doppler imaging with normal direction of blood flow towards the liver. IVC: No abnormality visualized. Pancreas: Suboptimally evaluated due to overlying bowel gas. Spleen: Size and appearance within normal limits. Right Kidney: Length: 9.4 cm. No urinary tract dilation or shadowing calculi. Left Kidney: Length: 10.0 cm. No urinary tract dilation or shadowing calculi. Abdominal aorta: No aneurysm visualized. Other findings: None. IMPRESSION: 1. Cholelithiasis without sonographic evidence of acute cholecystitis. 2. Diffusely increased hepatic echogenicity can be seen in the setting of infiltrative process such as steatosis. Electronically Signed   By: Agustin Cree M.D.   On: 03/01/2023 10:28    Procedures Procedures    Medications Ordered in ED Medications  LORazepam (ATIVAN) 2 MG/ML injection (  Not Given 03/01/23 0835)  ondansetron (ZOFRAN-ODT) disintegrating tablet 4 mg (4 mg Oral Given 03/01/23 0752)  LORazepam (ATIVAN) injection 1 mg (1 mg Intravenous Given 03/01/23 0829)  lactated ringers bolus 1,000 mL (0 mLs Intravenous Stopped 03/01/23 1119)  famotidine (PEPCID) IVPB 20 mg premix (0 mg Intravenous Stopped 03/01/23 0904)  droperidol (INAPSINE) 2.5 MG/ML injection 1.25 mg (1.25 mg Intravenous Given 03/01/23 0908)    ED Course/ Medical Decision Making/ A&P                             Medical Decision Making Female, previously well presents with acute onset epigastric pain, nausea, vomiting.  Absent alcohol, drugs, cigarettes, suspicion for acute gastritis versus hepatobiliary dysfunction, patient had ultrasound, labs initial Ativan fluids, required droperidol after Ativan  did not change her nausea, vomiting, nor did the initial Zofran.   Amount and/or Complexity of Data Reviewed Independent Historian: parent Labs: ordered. Decision-making details documented in ED Course. Radiology: ordered and independent interpretation performed. Decision-making details documented in ED Course. ECG/medicine tests: ordered and independent interpretation performed. Decision-making details documented in ED Course.  Risk Prescription drug management. Decision regarding hospitalization.  Cardiac 90 sinus normal Pulse ox 100% room air normal  12:07 PM Patient markedly better has not vomited in some time, has been tolerant of oral intake.  She is now coming by her mother.  We all discussed today's evaluation, which is notable mostly for presence of gallstones, but no evidence for hepatic dysfunction, and nausea, vomiting has resolved.  Patient  in no positive Murphy sign, and epigastric pain in the context of recent ingestion of a possible food precipitant for her acute nausea, vomiting.  Now with resolution of symptoms, no hemodynamic instability, little evidence for acute cholecystitis, the patient is discharged with antiemetics, outpatient surgery and GI given some ultrasound evidence for hepatic echogenicity with concern for steatosis.        Final Clinical Impression(s) / ED Diagnoses Final diagnoses:  Epigastric pain  Bilious vomiting with nausea    Rx / DC Orders ED Discharge Orders          Ordered    ondansetron (ZOFRAN-ODT) 4 MG disintegrating tablet  Every 8 hours PRN        03/01/23 1205              Gerhard Munch, MD 03/01/23 1207

## 2023-03-01 NOTE — Discharge Instructions (Addendum)
As discussed, today's evaluation has been generally reassuring.  However, your ultrasound did show some evidence for gallstones as well as changes in your liver that should be considered during an outpatient follow-up.  Please be sure to stay well-hydrated, monitor your condition carefully and follow-up with our surgery colleagues as well as with our gastroenterology colleagues.  Return here for concerning changes in your condition.  Your ultrasound results from today's study are included below: IMPRESSION: 1. Cholelithiasis without sonographic evidence of acute cholecystitis. 2. Diffusely increased hepatic echogenicity can be seen in the setting of infiltrative process such as steatosis.

## 2023-03-01 NOTE — ED Notes (Signed)
Pt unable to provide UA at this time. Obviously uncomfortable

## 2023-03-01 NOTE — ED Triage Notes (Signed)
Pt here with upper abdominal pain onset 0100 today along with nausea vomiting. Denies diarrhea or fevers.

## 2023-03-07 ENCOUNTER — Encounter: Payer: Self-pay | Admitting: Gastroenterology

## 2023-03-07 ENCOUNTER — Ambulatory Visit (INDEPENDENT_AMBULATORY_CARE_PROVIDER_SITE_OTHER): Payer: Self-pay | Admitting: Gastroenterology

## 2023-03-07 VITALS — BP 110/62 | HR 90 | Ht 63.0 in | Wt 155.0 lb

## 2023-03-07 DIAGNOSIS — R1011 Right upper quadrant pain: Secondary | ICD-10-CM | POA: Insufficient documentation

## 2023-03-07 DIAGNOSIS — R1013 Epigastric pain: Secondary | ICD-10-CM | POA: Insufficient documentation

## 2023-03-07 DIAGNOSIS — R112 Nausea with vomiting, unspecified: Secondary | ICD-10-CM | POA: Insufficient documentation

## 2023-03-07 NOTE — Progress Notes (Signed)
03/07/2023 Karina Ray [id number] 1986-03-04   HISTORY OF PRESENT ILLNESS: This is a 37 year old female who is new to our office.  She was seen in the ER last week and was told to follow-up with GI.  Does not look like there was any formal referral placed.  Nonetheless, she is here today with complaints of upper abdominal pain with associated nausea and vomiting.  She says that a week ago when she went to the ER she woke up with pain in her upper abdomen around 1 AM.  Says initially it seemed to be in the middle and then went more to the right.  She took some Pepto-Bismol but immediately vomited that.  Then she tried some Kaopectate and vomited that up as well.  The next morning she went to the emergency department.  Ultrasound showed what looks like fatty liver and cholelithiasis.  CBC, hepatic function panel, and lipase were unremarkable.  She was treated symptomatically and told to follow-up with the surgeons and GI.  Has an appointment with the surgeons next week.  She tells me that for about 2 or 3 days a few weeks ago she had pure black stool.  That then resolved.  She says that for about the past year she has had some issues on and off with what she describes as indigestion, discomfort in her upper abdomen.  Says she would take Tums or something similar, she thinks that she did take PPI just intermittently on an as-needed basis for a little while as well, but overall she does not like to take medication unless absolutely necessary.  From the episode occurred last week, she says that the pain really just completely resolved 2 days ago.  Not currently having any symptoms at this time.  No NSAID use.   Past Medical History:  Diagnosis Date   Abnormal Pap smear of cervix    Past Surgical History:  Procedure Laterality Date   CERVICAL CERCLAGE     COLPOSCOPY  02/2016; 10/2018, 03/2020   LEEP  05/05/2020    reports that she has never smoked. She has never used smokeless tobacco. She  reports that she does not drink alcohol and does not use drugs. Family history is unknown by patient. Allergies  Allergen Reactions   Penicillins Hives      Outpatient Encounter Medications as of 03/07/2023  Medication Sig   Ferrous Fumarate (HEMOCYTE - 106 MG FE) 324 (106 Fe) MG TABS tablet Take 1 tablet by mouth every other day. (Patient not taking: Reported on 03/07/2023)   JUNEL FE 24 1-20 MG-MCG(24) tablet Take 1 tablet by mouth daily. (Patient not taking: Reported on 03/07/2023)   norgestimate-ethinyl estradiol (SPRINTEC 28) 0.25-35 MG-MCG tablet Take 1 tablet by mouth 3 times a day for 3 days.  Then take 1 tablet by mouth 2 times a day for 2 days.  Then take 1 tablet by mouth daily for the remainder of the pack. (Patient not taking: Reported on 03/07/2023)   ondansetron (ZOFRAN-ODT) 4 MG disintegrating tablet Take 1 tablet (4 mg total) by mouth every 8 (eight) hours as needed for nausea or vomiting. (Patient not taking: Reported on 03/07/2023)   sertraline (ZOLOFT) 25 MG tablet Take 1 tablet (25 mg total) by mouth daily. (Patient not taking: Reported on 03/07/2023)   No facility-administered encounter medications on file as of 03/07/2023.    REVIEW OF SYSTEMS  : All other systems reviewed and negative except where noted in the History of Present Illness.  PHYSICAL EXAM: BP 110/62   Pulse 90   Ht 5\' 3"  (1.6 m)   Wt 155 lb (70.3 kg)   BMI 27.46 kg/m  General: Well developed female in no acute distress Head: Normocephalic and atraumatic Eyes:  Sclerae anicteric, conjunctiva pink. Ears: Normal auditory acuity Lungs: Clear throughout to auscultation; no W/R/R. Heart: Regular rate and rhythm; no M/R/G. Abdomen: Soft, non-distended.  BS present.  Non-tender. Musculoskeletal: Symmetrical with no gross deformities  Skin: No lesions on visible extremities Extremities: No edema  Neurological: Alert oriented x 4, grossly non-focal Psychological:  Alert and cooperative. Normal mood and  affect  ASSESSMENT AND PLAN: *37 year old female with complaints of epigastric abdominal pain, right upper quadrant abdominal pain, nausea and vomiting: Has gallstones on imaging.  Question if this is symptomatic cholelithiasis versus esophagitis, gastritis, reflux.  Had 2 to 3 days with really black stool a few weeks ago.  She has an appointment with the surgeons next week.  Will go ahead and proceed with EGD this week with Dr. [name].  She does not want to take PPI or really any other medication for that matter unless absolutely necessary.  The risks, benefits, and alternatives to EGD were discussed with the patient and she consents to proceed.    CC:  [facility], [employer] [facility]

## 2023-03-07 NOTE — Patient Instructions (Signed)
You have been scheduled for an endoscopy. Please follow written instructions given to you at your visit today. If you use inhalers (even only as needed), please bring them with you on the day of your procedure.   Due to recent changes in healthcare laws, you may see the results of your imaging and laboratory studies on MyChart before your provider has had a chance to review them.  We understand that in some cases there may be results that are confusing or concerning to you. Not all laboratory results come back in the same time frame and the provider may be waiting for multiple results in order to interpret others.  Please give [place] 48 hours in order for your provider to thoroughly review all the results before contacting the office for clarification of your results.    _______________________________________________________  If your blood pressure at your visit was 140/90 or greater, please contact your primary care physician to follow up on this.  _______________________________________________________  If you are age 28 or older, your body mass index should be between 23-30. Your Body mass index is 27.46 kg/m. If this is out of the aforementioned range listed, please consider follow up with your Primary Care Provider.  If you are age 44 or younger, your body mass index should be between 19-25. Your Body mass index is 27.46 kg/m. If this is out of the aformentioned range listed, please consider follow up with your Primary Care Provider.   ________________________________________________________  The [facility] providers would like to encourage you to use [facility] to communicate with providers for non-urgent requests or questions.  Due to long hold times on the telephone, sending your provider a message by [facility] may be a faster and more efficient way to get a response.  Please allow 48 business hours for a response.  Please remember that this is for non-urgent requests.   _______________________________________________________ I appreciate the  opportunity to care for you  Thank You   Shanda Bumps Zehr,PA-C

## 2023-03-09 NOTE — Progress Notes (Signed)
I agree with the assessment and plan as outlined by Ms. Zehr. 

## 2023-03-10 ENCOUNTER — Other Ambulatory Visit: Payer: Self-pay

## 2023-03-10 ENCOUNTER — Ambulatory Visit (AMBULATORY_SURGERY_CENTER): Payer: Self-pay | Admitting: Internal Medicine

## 2023-03-10 ENCOUNTER — Encounter: Payer: Self-pay | Admitting: Internal Medicine

## 2023-03-10 VITALS — BP 112/65 | HR 83 | Temp 97.8°F | Resp 12 | Ht 63.0 in | Wt 155.0 lb

## 2023-03-10 DIAGNOSIS — R1013 Epigastric pain: Secondary | ICD-10-CM

## 2023-03-10 DIAGNOSIS — R110 Nausea: Secondary | ICD-10-CM

## 2023-03-10 DIAGNOSIS — K2950 Unspecified chronic gastritis without bleeding: Secondary | ICD-10-CM

## 2023-03-10 DIAGNOSIS — K2970 Gastritis, unspecified, without bleeding: Secondary | ICD-10-CM

## 2023-03-10 MED ORDER — SODIUM CHLORIDE 0.9 % IV SOLN: 500.0000 mL | Freq: Once | INTRAVENOUS | Status: DC

## 2023-03-10 MED ORDER — OMEPRAZOLE 40 MG PO CPDR
40.0000 mg | DELAYED_RELEASE_CAPSULE | Freq: Every day | ORAL | 2 refills | Status: AC
Start: 2023-03-10 — End: ?

## 2023-03-10 NOTE — Progress Notes (Signed)
GASTROENTEROLOGY PROCEDURE H&P NOTE   Primary Care Physician: [facility]    Reason for Procedure:   Epigastric ab pain, RUQ ab pain, N&V  Plan:    EGD  Patient is appropriate for endoscopic procedure(s) in the ambulatory ([facility]) setting.  The nature of the procedure, as well as the risks, benefits, and alternatives were carefully and thoroughly reviewed with the patient. Ample time for discussion and questions allowed. The patient understood, was satisfied, and agreed to proceed.     HPI: Karina Ray is a 37 y.o. female who presents for EGD for evaluation of epigastric ab pain, RUQ ab pain, N&V.  Patient was most recently seen in the Gastroenterology Clinic on 03/07/23.  No interval change in medical history since that appointment. Please refer to that note for full details regarding GI history and clinical presentation.   Past Medical History:  Diagnosis Date   Abnormal Pap smear of cervix     Past Surgical History:  Procedure Laterality Date   CERVICAL CERCLAGE     COLPOSCOPY  02/2016; 10/2018, 03/2020   LEEP  05/05/2020    Prior to Admission medications   Medication Sig Start Date End Date Taking? Authorizing Provider  Ferrous Fumarate (HEMOCYTE - 106 MG FE) 324 (106 Fe) MG TABS tablet Take 1 tablet by mouth every other day. Patient not taking: Reported on 03/07/2023 09/28/22   [provider]  JUNEL FE 24 1-20 MG-MCG(24) tablet Take 1 tablet by mouth daily. Patient not taking: Reported on 03/07/2023 09/26/22   [provider]  norgestimate-ethinyl estradiol (SPRINTEC 28) 0.25-35 MG-MCG tablet Take 1 tablet by mouth 3 times a day for 3 days.  Then take 1 tablet by mouth 2 times a day for 2 days.  Then take 1 tablet by mouth daily for the remainder of the pack. Patient not taking: Reported on 03/07/2023 05/11/22   Arnaldo Natal, MD  ondansetron (ZOFRAN-ODT) 4 MG disintegrating tablet Take 1 tablet (4 mg total) by mouth every 8 (eight)  hours as needed for nausea or vomiting. Patient not taking: Reported on 03/07/2023 03/01/23   Gerhard Munch, MD  sertraline (ZOLOFT) 25 MG tablet Take 1 tablet (25 mg total) by mouth daily. Patient not taking: Reported on 03/07/2023 08/18/22   Mardella Layman, MD    Current Outpatient Medications  Medication Sig Dispense Refill   Ferrous Fumarate (HEMOCYTE - 106 MG FE) 324 (106 Fe) MG TABS tablet Take 1 tablet by mouth every other day. (Patient not taking: Reported on 03/07/2023)     JUNEL FE 24 1-20 MG-MCG(24) tablet Take 1 tablet by mouth daily. (Patient not taking: Reported on 03/07/2023)     norgestimate-ethinyl estradiol (SPRINTEC 28) 0.25-35 MG-MCG tablet Take 1 tablet by mouth 3 times a day for 3 days.  Then take 1 tablet by mouth 2 times a day for 2 days.  Then take 1 tablet by mouth daily for the remainder of the pack. (Patient not taking: Reported on 03/07/2023) 28 tablet 1   ondansetron (ZOFRAN-ODT) 4 MG disintegrating tablet Take 1 tablet (4 mg total) by mouth every 8 (eight) hours as needed for nausea or vomiting. (Patient not taking: Reported on 03/07/2023) 20 tablet 0   sertraline (ZOLOFT) 25 MG tablet Take 1 tablet (25 mg total) by mouth daily. (Patient not taking: Reported on 03/07/2023) 30 tablet 1   Current Facility-Administered Medications  Medication Dose Route Frequency Provider Last Rate Last Admin   0.9 %  sodium chloride infusion  500 mL Intravenous Once Imogene Burn, MD        Allergies as of 03/10/2023 - Review Complete 03/10/2023  Allergen Reaction Noted   Penicillins Hives 05/10/2022    Family History  Family history unknown: Yes    Social History   Socioeconomic History   Marital status: Single    Spouse name: Not on file   Number of children: Not on file   Years of education: Not on file   Highest education level: Not on file  Occupational History   Not on file  Tobacco Use   Smoking status: Never   Smokeless tobacco: Never  Vaping Use   Vaping Use:  Never used  Substance and Sexual Activity   Alcohol use: No   Drug use: No   Sexual activity: Yes    Birth control/protection: None  Other Topics Concern   Not on file  Social History Narrative   Not on file   Social Determinants of Health   Financial Resource Strain: Not on file  Food Insecurity: Not on file  Transportation Needs: Not on file  Physical Activity: Not on file  Stress: Not on file  Social Connections: Not on file  Intimate Partner Violence: Not on file    Physical Exam: Vital signs in last 24 hours: BP 107/64   Pulse 83   Temp 97.8 F (36.6 C) (Temporal)   Ht 5\' 3"  (1.6 m)   Wt 155 lb (70.3 kg)   SpO2 100%   BMI 27.46 kg/m  GEN: NAD EYE: Sclerae anicteric ENT: MMM CV: Non-tachycardic Pulm: No increased WOB GI: Soft NEURO:  Alert & Oriented   [name], MD [facility]   03/10/2023 10:06 AM

## 2023-03-10 NOTE — Op Note (Signed)
[facility] Patient Name: Karina Ray Procedure Date: 03/10/2023 10:49 AM MRN: [id number] Endoscopist: Particia Lather , , [id number] Age: 37 Referring MD:  Date of Birth: 1986-08-20 Gender: Female Account #: [id number] Procedure:                Upper GI endoscopy Indications:              Epigastric abdominal pain, Abdominal pain in the                            right upper quadrant, Nausea with vomiting Medicines:                Monitored Anesthesia Care Procedure:                Pre-Anesthesia Assessment:                           - Prior to the procedure, a History and Physical                            was performed, and patient medications and                            allergies were reviewed. The patient's tolerance of                            previous anesthesia was also reviewed. The risks                            and benefits of the procedure and the sedation                            options and risks were discussed with the patient.                            All questions were answered, and informed consent                            was obtained. Prior Anticoagulants: The patient has                            taken no anticoagulant or antiplatelet agents. ASA                            Grade Assessment: I - A normal, healthy patient.                            After reviewing the risks and benefits, the patient                            was deemed in satisfactory condition to undergo the                            procedure.  After obtaining informed consent, the endoscope was                            passed under direct vision. Throughout the                            procedure, the patient's blood pressure, pulse, and                            oxygen saturations were monitored continuously. The                            GIF [id number] #[id number] was introduced through the                            mouth, and advanced  to the second part of duodenum.                            The upper GI endoscopy was accomplished without                            difficulty. The patient tolerated the procedure                            well. Scope In: Scope Out: Findings:                 The examined esophagus was normal.                           Localized inflammation characterized by congestion                            (edema) and erythema was found in the gastric body.                            Biopsies were taken with a cold forceps for                            histology.                           The examined duodenum was normal. Complications:            No immediate complications. Estimated Blood Loss:     Estimated blood loss was minimal. Impression:               - Normal esophagus.                           - Gastritis. Biopsied.                           - Normal examined duodenum. Recommendation:           - Discharge patient to home (with escort).                           -  Use Prilosec (omeprazole) 40 mg PO daily for 8                            weeks.                           - Await pathology results.                           - Return to GI clinic in 8 weeks.                           - The findings and recommendations were discussed                            with the patient. Dr Particia Lather "[name]" [name],  03/10/2023 11:08:15 AM

## 2023-03-10 NOTE — Patient Instructions (Signed)
Prilosec 40 mg by mouth daily for 8 weeks (RX at pharmacy_  Await pathology results. Return to GI clinic in 8 weeks (someone will call you to make an appointment).  YOU HAD AN ENDOSCOPIC PROCEDURE TODAY AT THE [facility]:   Refer to the procedure report that was given to you for any specific questions about what was found during the examination.  If the procedure report does not answer your questions, please call your gastroenterologist to clarify.  If you requested that your care partner not be given the details of your procedure findings, then the procedure report has been included in a sealed envelope for you to review at your convenience later.  YOU SHOULD EXPECT: Some feelings of bloating in the abdomen. Passage of more gas than usual.  Walking can help get rid of the air that was put into your GI tract during the procedure and reduce the bloating. If you had a lower endoscopy (such as a colonoscopy or flexible sigmoidoscopy) you may notice spotting of blood in your stool or on the toilet paper. If you underwent a bowel prep for your procedure, you may not have a normal bowel movement for a few days.  Please Note:  You might notice some irritation and congestion in your nose or some drainage.  This is from the oxygen used during your procedure.  There is no need for concern and it should clear up in a day or so.  SYMPTOMS TO REPORT IMMEDIATELY:  Following upper endoscopy (EGD)  Vomiting of blood or coffee ground material  New chest pain or pain under the shoulder blades  Painful or persistently difficult swallowing  New shortness of breath  Fever of 100F or higher  Black, tarry-looking stools  For urgent or emergent issues, a gastroenterologist can be reached at any hour by calling [phone]. Do not use MyChart messaging for urgent concerns.    DIET:  We do recommend a small meal at first, but then you may proceed to your regular diet.  Drink plenty of fluids but you  should avoid alcoholic beverages for 24 hours.  ACTIVITY:  You should plan to take it easy for the rest of today and you should NOT DRIVE or use heavy machinery until tomorrow (because of the sedation medicines used during the test).    FOLLOW UP: Our staff will call the number listed on your records the next business day following your procedure.  We will call around 7:15- 8:00 am to check on you and address any questions or concerns that you may have regarding the information given to you following your procedure. If we do not reach you, we will leave a message.     If any biopsies were taken you will be contacted by phone or by letter within the next 1-3 weeks.  Please call us at [phone] if you have not heard about the biopsies in 3 weeks.    SIGNATURES/CONFIDENTIALITY: You and/or your care partner have signed paperwork which will be entered into your electronic medical record.  These signatures attest to the fact that that the information above on your After Visit Summary has been reviewed and is understood.  Full responsibility of the confidentiality of this discharge information lies with you and/or your care-partner.

## 2023-03-10 NOTE — Progress Notes (Signed)
Uneventful anesthetic. Report to pacu rn. Vss on [place] O2. Report to pacu rn.

## 2023-03-10 NOTE — Progress Notes (Signed)
VS completed DT. ? ? ?Pt's states no medical or surgical changes since previsit or office visit. ? ?

## 2023-03-10 NOTE — Progress Notes (Signed)
Called to room to assist during endoscopic procedure.  Patient ID and intended procedure confirmed with present staff. Received instructions for my participation in the procedure from the performing physician.  

## 2023-03-13 ENCOUNTER — Telehealth: Payer: Self-pay

## 2023-03-13 NOTE — Telephone Encounter (Signed)
Attempted follow up phone call with patient after endoscopic procedure. There was no answer; left VM.

## 2023-03-13 NOTE — Telephone Encounter (Signed)
-----   Message from Skip Estimable, RN sent at 03/10/2023 11:30 AM EDT ----- Dr. [name] would like follow up in 8 weeks.  No appt's available for [place] or Shanda Bumps.  Please schedule. Thank you, STacee

## 2023-03-13 NOTE — Telephone Encounter (Signed)
Pt scheduled for 8-week follow up on 05/08/23 at 9:30 am.

## 2023-03-14 ENCOUNTER — Encounter: Payer: Self-pay | Admitting: Internal Medicine

## 2023-05-08 ENCOUNTER — Ambulatory Visit: Payer: Self-pay | Admitting: Internal Medicine
